# Patient Record
Sex: Female | Born: 1960 | Race: White | Hispanic: No | Marital: Single | Smoking: Former smoker
Health system: Southern US, Community
[De-identification: ages and names within clinical notes are randomized; demographics above are authoritative.]

## PROBLEM LIST (undated history)

## (undated) DIAGNOSIS — G43909 Migraine, unspecified, not intractable, without status migrainosus: Secondary | ICD-10-CM

## (undated) DIAGNOSIS — I639 Cerebral infarction, unspecified: Secondary | ICD-10-CM

## (undated) DIAGNOSIS — M549 Dorsalgia, unspecified: Secondary | ICD-10-CM

## (undated) DIAGNOSIS — I10 Essential (primary) hypertension: Secondary | ICD-10-CM

## (undated) DIAGNOSIS — F419 Anxiety disorder, unspecified: Secondary | ICD-10-CM

## (undated) DIAGNOSIS — S060XAA Concussion with loss of consciousness status unknown, initial encounter: Secondary | ICD-10-CM

## (undated) DIAGNOSIS — S060X9A Concussion with loss of consciousness of unspecified duration, initial encounter: Secondary | ICD-10-CM

## (undated) DIAGNOSIS — S32009A Unspecified fracture of unspecified lumbar vertebra, initial encounter for closed fracture: Secondary | ICD-10-CM

## (undated) HISTORY — PX: BREAST SURGERY: SHX581

## (undated) HISTORY — PX: TONSILECTOMY/ADENOIDECTOMY WITH MYRINGOTOMY: SHX6125

---

## 2006-12-25 ENCOUNTER — Emergency Department (HOSPITAL_COMMUNITY): Admission: EM | Admit: 2006-12-25 | Discharge: 2006-12-25 | Payer: Self-pay | Admitting: Emergency Medicine

## 2007-08-29 ENCOUNTER — Emergency Department (HOSPITAL_COMMUNITY): Admission: EM | Admit: 2007-08-29 | Discharge: 2007-08-30 | Payer: Self-pay | Admitting: Emergency Medicine

## 2008-09-07 ENCOUNTER — Emergency Department (HOSPITAL_COMMUNITY): Admission: EM | Admit: 2008-09-07 | Discharge: 2008-09-07 | Payer: Self-pay | Admitting: Emergency Medicine

## 2009-12-23 ENCOUNTER — Observation Stay (HOSPITAL_COMMUNITY): Admission: EM | Admit: 2009-12-23 | Discharge: 2009-12-25 | Payer: Self-pay | Admitting: Emergency Medicine

## 2009-12-23 ENCOUNTER — Ambulatory Visit: Payer: Self-pay | Admitting: Cardiology

## 2009-12-24 ENCOUNTER — Encounter (INDEPENDENT_AMBULATORY_CARE_PROVIDER_SITE_OTHER): Payer: Self-pay | Admitting: Internal Medicine

## 2010-08-31 LAB — DIFFERENTIAL
Basophils Absolute: 0 10*3/uL (ref 0.0–0.1)
Basophils Relative: 0 % (ref 0–1)
Eosinophils Absolute: 0 10*3/uL (ref 0.0–0.7)
Lymphs Abs: 1 10*3/uL (ref 0.7–4.0)
Monocytes Absolute: 1.4 10*3/uL — ABNORMAL HIGH (ref 0.1–1.0)
Neutro Abs: 9.5 10*3/uL — ABNORMAL HIGH (ref 1.7–7.7)

## 2010-08-31 LAB — COMPREHENSIVE METABOLIC PANEL
ALT: 52 U/L — ABNORMAL HIGH (ref 0–35)
AST: 68 U/L — ABNORMAL HIGH (ref 0–37)
Alkaline Phosphatase: 115 U/L (ref 39–117)
Alkaline Phosphatase: 126 U/L — ABNORMAL HIGH (ref 39–117)
BUN: 3 mg/dL — ABNORMAL LOW (ref 6–23)
CO2: 22 mEq/L (ref 19–32)
CO2: 28 mEq/L (ref 19–32)
Calcium: 8.3 mg/dL — ABNORMAL LOW (ref 8.4–10.5)
Chloride: 103 mEq/L (ref 96–112)
Creatinine, Ser: 0.65 mg/dL (ref 0.4–1.2)
GFR calc Af Amer: >60 mL/min (ref >60)
GFR calc non Af Amer: >60 mL/min (ref >60)
GFR calc non Af Amer: >60 mL/min (ref >60)
Glucose, Bld: 170 mg/dL — ABNORMAL HIGH (ref 70–99)
Potassium: 3.2 mEq/L — ABNORMAL LOW (ref 3.5–5.1)
Sodium: 122 mEq/L — ABNORMAL LOW (ref 135–145)
Sodium: 138 mEq/L (ref 135–145)
Total Bilirubin: 0.5 mg/dL (ref 0.3–1.2)

## 2010-08-31 LAB — CBC
HCT: 40.3 % (ref 36.0–46.0)
Hemoglobin: 12.3 g/dL (ref 12.0–15.0)
MCH: 36 pg — ABNORMAL HIGH (ref 26.0–34.0)
MCHC: 34.2 g/dL (ref 30.0–36.0)
MCV: 102.4 fL — ABNORMAL HIGH (ref 78.0–100.0)
RBC: 3.43 MIL/uL — ABNORMAL LOW (ref 3.87–5.11)
RDW: 13.6 % (ref 11.5–15.5)

## 2010-08-31 LAB — GLUCOSE, CAPILLARY
Glucose-Capillary: 131 mg/dL — ABNORMAL HIGH (ref 70–99)
Glucose-Capillary: 190 mg/dL — ABNORMAL HIGH (ref 70–99)
Glucose-Capillary: 94 mg/dL (ref 70–99)
Glucose-Capillary: 99 mg/dL (ref 70–99)

## 2010-08-31 LAB — CK TOTAL AND CKMB (NOT AT ARMC): Total CK: 76 U/L (ref 7–177)

## 2010-08-31 LAB — VITAMIN B12: Vitamin B-12: 1053 pg/mL — ABNORMAL HIGH (ref 211–911)

## 2010-08-31 LAB — URINALYSIS, ROUTINE W REFLEX MICROSCOPIC
Glucose, UA: NEGATIVE mg/dL
Hgb urine dipstick: NEGATIVE
Specific Gravity, Urine: 1.006 (ref 1.005–1.030)
pH: 6 (ref 5.0–8.0)

## 2010-08-31 LAB — POCT CARDIAC MARKERS
CKMB, poc: <1 ng/mL — ABNORMAL LOW (ref 1.0–8.0)
Troponin i, poc: <0.05 ng/mL (ref 0.00–0.09)

## 2010-08-31 LAB — D-DIMER, QUANTITATIVE: D-Dimer, Quant: 4.17 ug/mL-FEU — ABNORMAL HIGH (ref 0.00–0.48)

## 2010-08-31 LAB — CARDIAC PANEL(CRET KIN+CKTOT+MB+TROPI)
CK, MB: 2 ng/mL (ref 0.3–4.0)
Relative Index: INVALID (ref 0.0–2.5)
Total CK: 85 U/L (ref 7–177)
Troponin I: 0.01 ng/mL (ref 0.00–0.06)

## 2010-08-31 LAB — TSH: TSH: 0.363 u[IU]/mL (ref 0.350–4.500)

## 2010-09-25 LAB — BASIC METABOLIC PANEL
BUN: 1 mg/dL — ABNORMAL LOW (ref 6–23)
Calcium: 9.1 mg/dL (ref 8.4–10.5)
Creatinine, Ser: 0.49 mg/dL (ref 0.4–1.2)
GFR calc Af Amer: >60 mL/min (ref >60)

## 2010-09-25 LAB — D-DIMER, QUANTITATIVE: D-Dimer, Quant: 0.42 ug/mL-FEU (ref 0.00–0.48)

## 2010-09-25 LAB — CBC
MCHC: 34.7 g/dL (ref 30.0–36.0)
Platelets: 204 10*3/uL (ref 150–400)
RBC: 4.23 MIL/uL (ref 3.87–5.11)
WBC: 11.8 10*3/uL — ABNORMAL HIGH (ref 4.0–10.5)

## 2010-09-25 LAB — POCT CARDIAC MARKERS
Myoglobin, poc: 39.4 ng/mL (ref 12–200)
Troponin i, poc: <0.05 ng/mL (ref 0.00–0.09)

## 2014-10-13 ENCOUNTER — Encounter (HOSPITAL_COMMUNITY): Payer: Self-pay | Admitting: Emergency Medicine

## 2014-10-13 ENCOUNTER — Emergency Department (HOSPITAL_COMMUNITY)
Admission: EM | Admit: 2014-10-13 | Discharge: 2014-10-13 | Disposition: A | Discharge status: Home / Self Care | Payer: BLUE CROSS/BLUE SHIELD | Attending: Emergency Medicine | Admitting: Emergency Medicine

## 2014-10-13 ENCOUNTER — Emergency Department (HOSPITAL_COMMUNITY): Payer: BLUE CROSS/BLUE SHIELD

## 2014-10-13 DIAGNOSIS — Y998 Other external cause status: Secondary | ICD-10-CM | POA: Diagnosis not present

## 2014-10-13 DIAGNOSIS — I720 Aneurysm of carotid artery: Secondary | ICD-10-CM | POA: Insufficient documentation

## 2014-10-13 DIAGNOSIS — Z7982 Long term (current) use of aspirin: Secondary | ICD-10-CM | POA: Insufficient documentation

## 2014-10-13 DIAGNOSIS — S0001XA Abrasion of scalp, initial encounter: Secondary | ICD-10-CM | POA: Insufficient documentation

## 2014-10-13 DIAGNOSIS — W01198A Fall on same level from slipping, tripping and stumbling with subsequent striking against other object, initial encounter: Secondary | ICD-10-CM | POA: Diagnosis not present

## 2014-10-13 DIAGNOSIS — Z79899 Other long term (current) drug therapy: Secondary | ICD-10-CM | POA: Insufficient documentation

## 2014-10-13 DIAGNOSIS — S060X1A Concussion with loss of consciousness of 30 minutes or less, initial encounter: Secondary | ICD-10-CM | POA: Diagnosis not present

## 2014-10-13 DIAGNOSIS — Y9289 Other specified places as the place of occurrence of the external cause: Secondary | ICD-10-CM | POA: Insufficient documentation

## 2014-10-13 DIAGNOSIS — Z8781 Personal history of (healed) traumatic fracture: Secondary | ICD-10-CM | POA: Insufficient documentation

## 2014-10-13 DIAGNOSIS — S0990XA Unspecified injury of head, initial encounter: Secondary | ICD-10-CM | POA: Diagnosis present

## 2014-10-13 DIAGNOSIS — Z87891 Personal history of nicotine dependence: Secondary | ICD-10-CM | POA: Insufficient documentation

## 2014-10-13 DIAGNOSIS — R51 Headache: Secondary | ICD-10-CM

## 2014-10-13 DIAGNOSIS — Y9389 Activity, other specified: Secondary | ICD-10-CM | POA: Diagnosis not present

## 2014-10-13 DIAGNOSIS — I671 Cerebral aneurysm, nonruptured: Secondary | ICD-10-CM

## 2014-10-13 DIAGNOSIS — R519 Headache, unspecified: Secondary | ICD-10-CM

## 2014-10-13 HISTORY — DX: Migraine, unspecified, not intractable, without status migrainosus: G43.909

## 2014-10-13 HISTORY — DX: Concussion with loss of consciousness of unspecified duration, initial encounter: S06.0X9A

## 2014-10-13 HISTORY — DX: Unspecified fracture of unspecified lumbar vertebra, initial encounter for closed fracture: S32.009A

## 2014-10-13 HISTORY — DX: Concussion with loss of consciousness status unknown, initial encounter: S06.0XAA

## 2014-10-13 MED ORDER — DIPHENHYDRAMINE HCL 50 MG/ML IJ SOLN
25.0000 mg | Freq: Once | INTRAMUSCULAR | Status: AC
  Administered 2014-10-13: 25 mg via INTRAVENOUS
  Filled 2014-10-13: qty 1

## 2014-10-13 MED ORDER — PROMETHAZINE HCL 25 MG/ML IJ SOLN
25.0000 mg | Freq: Once | INTRAMUSCULAR | Status: AC
  Administered 2014-10-13: 25 mg via INTRAVENOUS
  Filled 2014-10-13: qty 1

## 2014-10-13 MED ORDER — SODIUM CHLORIDE 0.9 % IV BOLUS (SEPSIS)
500.0000 mL | Freq: Once | INTRAVENOUS | Status: AC
  Administered 2014-10-13: 500 mL via INTRAVENOUS

## 2014-10-13 MED ORDER — KETOROLAC TROMETHAMINE 30 MG/ML IJ SOLN
30.0000 mg | Freq: Once | INTRAMUSCULAR | Status: AC
  Administered 2014-10-13: 30 mg via INTRAVENOUS
  Filled 2014-10-13: qty 1

## 2014-10-13 NOTE — Discharge Instructions (Signed)
Concussion A concussion, or closed-head injury, is a brain injury caused by a direct blow to the head or by a quick and sudden movement (jolt) of the head or neck. Concussions are usually not life-threatening. Even so, the effects of a concussion can be serious. If you have had a concussion before, you are more likely to experience concussion-like symptoms after a direct blow to the head.  CAUSES  Direct blow to the head, such as from running into another player during a soccer game, being hit in a fight, or hitting your head on a hard surface.  A jolt of the head or neck that causes the brain to move back and forth inside the skull, such as in a car crash. SIGNS AND SYMPTOMS The signs of a concussion can be hard to notice. Early on, they may be missed by you, family members, and health care providers. You may look fine but act or feel differently. Symptoms are usually temporary, but they may last for days, weeks, or even longer. Some symptoms may appear right away while others may not show up for hours or days. Every head injury is different. Symptoms include:  Mild to moderate headaches that will not go away.  A feeling of pressure inside your head.  Having more trouble than usual:  Learning or remembering things you have heard.  Answering questions.  Paying attention or concentrating.  Organizing daily tasks.  Making decisions and solving problems.  Slowness in thinking, acting or reacting, speaking, or reading.  Getting lost or being easily confused.  Feeling tired all the time or lacking energy (fatigued).  Feeling drowsy.  Sleep disturbances.  Sleeping more than usual.  Sleeping less than usual.  Trouble falling asleep.  Trouble sleeping (insomnia).  Loss of balance or feeling lightheaded or dizzy.  Nausea or vomiting.  Numbness or tingling.  Increased sensitivity to:  Sounds.  Lights.  Distractions.  Vision problems or eyes that tire  easily.  Diminished sense of taste or smell.  Ringing in the ears.  Mood changes such as feeling sad or anxious.  Becoming easily irritated or angry for little or no reason.  Lack of motivation.  Seeing or hearing things other people do not see or hear (hallucinations). DIAGNOSIS Your health care provider can usually diagnose a concussion based on a description of your injury and symptoms. He or she will ask whether you passed out (lost consciousness) and whether you are having trouble remembering events that happened right before and during your injury. Your evaluation might include:  A brain scan to look for signs of injury to the brain. Even if the test shows no injury, you may still have a concussion.  Blood tests to be sure other problems are not present. TREATMENT  Concussions are usually treated in an emergency department, in urgent care, or at a clinic. You may need to stay in the hospital overnight for further treatment.  Tell your health care provider if you are taking any medicines, including prescription medicines, over-the-counter medicines, and natural remedies. Some medicines, such as blood thinners (anticoagulants) and aspirin, may increase the chance of complications. Also tell your health care provider whether you have had alcohol or are taking illegal drugs. This information may affect treatment.  Your health care provider will send you home with important instructions to follow.  How fast you will recover from a concussion depends on many factors. These factors include how severe your concussion is, what part of your brain was injured, your  may affect treatment.  · Your health care provider will send you home with important instructions to follow.  · How fast you will recover from a concussion depends on many factors. These factors include how severe your concussion is, what part of your brain was injured, your age, and how healthy you were before the concussion.  · Most people with mild injuries recover fully. Recovery can take time. In general, recovery is slower in older persons. Also, persons who have had a concussion in the past or have other medical problems may find that it takes longer to recover from their current injury.  HOME  CARE INSTRUCTIONS  General Instructions  · Carefully follow the directions your health care provider gave you.  · Only take over-the-counter or prescription medicines for pain, discomfort, or fever as directed by your health care provider.  · Take only those medicines that your health care provider has approved.  · Do not drink alcohol until your health care provider says you are well enough to do so. Alcohol and certain other drugs may slow your recovery and can put you at risk of further injury.  · If it is harder than usual to remember things, write them down.  · If you are easily distracted, try to do one thing at a time. For example, do not try to watch TV while fixing dinner.  · Talk with family members or close friends when making important decisions.  · Keep all follow-up appointments. Repeated evaluation of your symptoms is recommended for your recovery.  · Watch your symptoms and tell others to do the same. Complications sometimes occur after a concussion. Older adults with a brain injury may have a higher risk of serious complications, such as a blood clot on the brain.  · Tell your teachers, school nurse, school counselor, coach, athletic trainer, or work manager about your injury, symptoms, and restrictions. Tell them about what you can or cannot do. They should watch for:  · Increased problems with attention or concentration.  · Increased difficulty remembering or learning new information.  · Increased time needed to complete tasks or assignments.  · Increased irritability or decreased ability to cope with stress.  · Increased symptoms.  · Rest. Rest helps the brain to heal. Make sure you:  · Get plenty of sleep at night. Avoid staying up late at night.  · Keep the same bedtime hours on weekends and weekdays.  · Rest during the day. Take daytime naps or rest breaks when you feel tired.  · Limit activities that require a lot of thought or concentration. These include:  · Doing homework or job-related  work.  · Watching TV.  · Working on the computer.  · Avoid any situation where there is potential for another head injury (football, hockey, soccer, basketball, martial arts, downhill snow sports and horseback riding). Your condition will get worse every time you experience a concussion. You should avoid these activities until you are evaluated by the appropriate follow-up health care providers.  Returning To Your Regular Activities  You will need to return to your normal activities slowly, not all at once. You must give your body and brain enough time for recovery.  · Do not return to sports or other athletic activities until your health care provider tells you it is safe to do so.  · Ask your health care provider when you can drive, ride a bicycle, or operate heavy machinery. Your ability to react may be slower after a   belt when riding in a car.  Drinking alcohol only in moderation.  Wearing a helmet when biking, skiing, skateboarding, skating, or doing similar activities.  Avoiding activities that could lead to a second concussion, such as contact or recreational sports, until your health care provider says it is okay.  Taking safety measures in your home.  Remove clutter and tripping hazards from floors and stairways.  Use grab bars in bathrooms and handrails by stairs.  Place non-slip mats on floors and in bathtubs.  Improve lighting in dim areas. SEEK MEDICAL CARE IF:  You have increased problems paying attention or  concentrating.  You have increased difficulty remembering or learning new information.  You need more time to complete tasks or assignments than before.  You have increased irritability or decreased ability to cope with stress.  You have more symptoms than before. Seek medical care if you have any of the following symptoms for more than 2 weeks after your injury:  Lasting (chronic) headaches.  Dizziness or balance problems.  Nausea.  Vision problems.  Increased sensitivity to noise or light.  Depression or mood swings.  Anxiety or irritability.  Memory problems.  Difficulty concentrating or paying attention.  Sleep problems.  Feeling tired all the time. SEEK IMMEDIATE MEDICAL CARE IF:  You have severe or worsening headaches. These may be a sign of a blood clot in the brain.  You have weakness (even if only in one hand, leg, or part of the face).  You have numbness.  You have decreased coordination.  You vomit repeatedly.  You have increased sleepiness.  One pupil is larger than the other.  You have convulsions.  You have slurred speech.  You have increased confusion. This may be a sign of a blood clot in the brain.  You have increased restlessness, agitation, or irritability.  You are unable to recognize people or places.  You have neck pain.  It is difficult to wake you up.  You have unusual behavior changes.  You lose consciousness. MAKE SURE YOU:  Understand these instructions.  Will watch your condition.  Will get help right away if you are not doing well or get worse. Document Released: 08/22/2003 Document Revised: 06/06/2013 Document Reviewed: 12/22/2012 [facility] Patient Information 2015 [place], [place]. This information is not intended to replace advice given to you by your health care provider. Make sure you discuss any questions you have with your health care provider.  Cerebral Aneurysm An aneurysm is the bulging or ballooning out  of part of the weakened wall of a vein or artery. An aneurysm in the vein or artery of the brain is called a brain aneurysm, or cerebral aneurysm.  Aneurysms are a risk to your health because they may leak or rupture. Once the aneurysm leaks or ruptures, bleeding occurs. If the bleeding occurs within the brain tissue, the condition is called an intracerebral hemorrhage. An intracerebral hemorrhage can result in a hemorrhagic stroke. If the bleeding occurs in the area between the brain and the thin tissues that cover the brain, the condition is called a subarachnoid hemorrhage. This increases the pressure on the brain and causes some areas of the brain to not get the necessary blood flow. The blood from the ruptured aneurysm collects and presses on the surrounding brain tissue. A subarachnoid hemorrhage can cause a stroke. A ruptured cerebral aneurysm is a medical emergency. This can cause permanent damage and loss of brain function. CAUSES A cerebral aneurysm is caused when a weakened part of  the blood vessel expands. The blood vessel expands due to the constant pressure from the flow of blood through the weakened blood vessel. Usually the aneurysm expands slowly. As the weakened aneurysm expands, the walls of the aneurysm become weaker. Aneurysms may be associated with diseases that weaken and damage the walls of your blood vessels or blood vessels that develop abnormally. Some known causes for cerebral aneurysms are:  Head trauma.  Infection.  Use of "recreational drugs" such as cocaine or amphetamines. RISK FACTORS People at risk for a cerebral aneurysm or hemorrhagic stroke usually have one or more risk factors, which include:  Having high blood pressure (hypertension).  Abusing alcohol.  Having abnormal blood vessels present since birth.  Having certain bleeding disorders, such as hemophilia, sickle cell disease, or liver disease.  Taking blood thinners (anticoagulants).  Smoking. SIGNS  AND SYMPTOMS  The signs and symptoms of an unruptured cerebral aneurysm will partly depend on its size and rate of growth. A small, unchanging aneurysm generally does not produce symptoms. A larger aneurysm that is steadily growing can increase pressure on the brain or nerves. That increased pressure from the unruptured cerebral aneurysm can cause:  A headache.  Problems with your vision.  Numbness or weakness in an arm or leg.  Problems with memory.  Problems speaking.  Seizures. If an aneurysm leaks or bursts, it can cause a stroke and be life-threatening. Symptoms may include:  A sudden, severe headache with no known cause. The headache is often described as the worst headache ever experienced.  Nausea or vomiting, especially when combined with other symptoms such as a headache.  Sudden weakness or numbness of the face, arm, or leg, especially on one side of the body.  Sudden trouble walking or difficulty moving arms or legs.  Sudden confusion.  Sudden personality changes.  Trouble speaking (aphasia) or understanding.  Difficulty swallowing.  Sudden trouble seeing in one or both eyes.  Double vision.  Dizziness.  Loss of balance or coordination.  Intolerance to light.  Stiff neck. DIAGNOSIS  A CTA (computed tomographic angiography) may be performed to diagnose an aneurysm. A CTA uses dye and a CT scanner to take images of your blood vessels. An MRA (magnetic resonance angiography) may be used to diagnose an aneurysm. An MRA is performed in an MRI machine. While in the MRI machine, images of your blood vessels are taken. A cerebral aneurysm may also be diagnosed with a cerebral angiogram. A cerebral angiogram requires a tube called a catheter to be inserted into a blood vessel and advanced to the blood vessels in your neck. Dye is then injected while X-ray images are taken to show the blood vessels in your brain. TREATMENT  Unruptured Aneurysms Treatment is complex  when an aneurysm is found and it is not causing problems. Treatment is very individualized, as each case is different. Many things must be considered, such as the size and exact location of your aneurysm, your age, your overall health, and your feelings and preferences. Small aneurysms in certain locations of the brain have a very low chance of bleeding or rupturing. These small aneurysms may not be treated. However, depending on the size and location of the aneurysm, treatments may be recommended and include:  Coiling. During this procedure, a catheter is inserted and advanced through a blood vessel. Once the catheter reaches the aneurysm, tiny coils are used to block blood flow into the aneurysm.  Surgical clipping. During surgery, a clip is placed at the base  of the aneurysm. The clip prevents blood from continuing to enter the aneurysm. Ruptured Aneurysms Immediate emergency surgery may be needed to help prevent damage to the brain and to reduce the risk of rebleeding. Timing of treatment is an important factor in the prevention of complications. Successful early treatment of a ruptured aneurysm (within the first 3 days of a bleed) helps to prevent rebleeding and blood vessel spasm. In some cases, there may be a reason to treat later (10-14 days after a rupture). Many things are considered when making this decision, and each case is handled individually. HOME CARE INSTRUCTIONS  Take medicines only as instructed by your health care provider.  Eat healthy foods. It is recommended that you eat 5 or more servings of fruits and vegetables each day. Foods may need to be a special consistency (soft or pureed), or small bites may need to be taken if you have had a ruptured aneurysm or stroke. Certain dietary changes may be advised to address high blood pressure, high cholesterol, diabetes, or obesity.  Food choices that are low in salt (sodium), saturated fat, trans fat, and cholesterol are recommended to  manage high blood pressure.  Food choices that are high in fiber and low in saturated fat, trans fat, and cholesterol are recommended to control cholesterol levels.  Controlling carbohydrate and sugar intake is recommended to manage diabetes.  Reducing calorie intake and making food choices that are low in sodium, saturated fat, trans fat, and cholesterol are recommended to manage obesity.  Maintain a healthy weight.  Stay physically active. It is recommended that you get at least 30 minutes of activity on most or all days.  Do not smoke.  Limit alcohol use. Moderate alcohol use is considered to be:  No more than 2 drinks each day for men.  No more than 1 drink each day for nonpregnant women.  Stop drug abuse.  A safe home environment is important to reduce the risk of falls. Your health care provider may arrange for specialists to evaluate your home. Having grab bars in the bedroom and bathroom is often important. Your health care provider may arrange for special equipment to be used at home, such as raised toilets and a seat for the shower.  Physical, occupational, and speech therapy. Ongoing therapy may be needed to maximize your recovery after a ruptured aneurysm or stroke. If you have been advised to use a walker or a cane, use it at all times. Be sure to keep your therapy appointments.  Follow all instructions for follow-up with your health care provider. This is very important. This includes any referrals, physical therapy, rehabilitation, and laboratory tests. Proper follow-up may prevent an aneurysm rupture or a stroke. SEEK IMMEDIATE MEDICAL CARE IF:  You have a sudden, severe headache with no known cause.  You have sudden nausea or vomiting with a severe headache.  You have sudden weakness or numbness of the face, arm, or leg, especially on one side of the body.  You have sudden trouble walking or difficulty moving arms or legs.  You have sudden confusion.  You have  trouble speaking or understanding.  You have sudden trouble seeing in one or both eyes.  You have a sudden loss of balance or coordination.  You have a stiff neck.  You have difficulty breathing.  You have a partial or total loss of consciousness. Any of these symptoms may represent a serious problem that is an emergency. Do not wait to see if the symptoms  will go away. Get medical help at once. Call your local emergency services (911 in [place] Do not drive yourself to the hospital. Document Released: 02/21/2002 Document Revised: 10/16/2013 Document Reviewed: 11/17/2012 [facility] Patient Information 2015 [employer] [place]. This information is not intended to replace advice given to you by your health care provider. Make sure you discuss any questions you have with your health care provider.

## 2014-10-13 NOTE — ED Provider Notes (Signed)
CSN: [id number]     Arrival date & time 10/13/14  [id number] History   First MD Initiated Contact with Patient 10/13/14 1001     Chief Complaint  Patient presents with  . Fall  . Headache     (Consider location/radiation/quality/duration/timing/severity/associated sxs/prior Treatment) Patient is a 54 y.o. female presenting with fall and headaches. The history is provided by the patient.  Fall Associated symptoms include headaches. Pertinent negatives include no chest pain, no abdominal pain and no shortness of breath.  Headache Associated symptoms: nausea, photophobia and vomiting   Associated symptoms: no abdominal pain, no back pain, no diarrhea, no eye pain, no neck stiffness, no numbness and no weakness    patient 2 days ago hit her head on a wall and then had a syncopal episode. Was not seen in the hospital at that time. Since then she has had headaches. It is somewhat similar to her previous migraines as throbbing the back of her head. She's had nausea. She's had some photophobia. No fevers. States the headache is continued to get worse. No visual changes. She has has some tingling in her hands and feet, which proceeded the injury and has been worked up in the past. States she had a total of 2 beers that day in the last one was an hour before she hit her head.  Past Medical History  Diagnosis Date  . Migraine   . Concussion   . Fracture of lumbar vertebra L 5 fracture   History reviewed. No pertinent past surgical history. No family history on file. History  Substance Use Topics  . Smoking status: Former Games developermoker  . Smokeless tobacco: Not on file  . Alcohol Use: Yes   OB History    No data available     Review of Systems  Constitutional: Negative for activity change and appetite change.  Eyes: Positive for photophobia. Negative for pain.  Respiratory: Negative for chest tightness and shortness of breath.   Cardiovascular: Negative for chest pain and leg swelling.   Gastrointestinal: Positive for nausea and vomiting. Negative for abdominal pain and diarrhea.  Genitourinary: Negative for flank pain.  Musculoskeletal: Negative for back pain and neck stiffness.  Skin: Negative for rash.  Neurological: Positive for headaches. Negative for weakness and numbness.  Psychiatric/Behavioral: Negative for behavioral problems.      Allergies  Review of patient's allergies indicates no known allergies.  Home Medications   Prior to Admission medications   Medication Sig Start Date End Date Taking? Authorizing Provider  acetaminophen (TYLENOL) 500 MG tablet Take 500 mg by mouth every 6 (six) hours as needed for headache.   Yes Historical Provider, MD  aspirin 325 MG EC tablet Take 325 mg by mouth daily as needed for pain (Back pain).   Yes Historical Provider, MD  Aspirin-Salicylamide-Caffeine (BC HEADACHE POWDER PO) Take 1 packet by mouth daily as needed (Headache).   Yes Historical Provider, MD  Hypromellose (ARTIFICIAL TEARS OP) Apply 1 drop to eye daily as needed (Dry eyes).   Yes Historical Provider, MD  Multiple Vitamin (MULTIVITAMIN WITH MINERALS) TABS tablet Take 1 tablet by mouth daily.   Yes Historical Provider, MD  NASAL SALINE NA Place 1 spray into the nose daily as needed (Nasal congestion, allergies).   Yes Historical Provider, MD  oxymetazoline (AFRIN) 0.05 % nasal spray Place 1 spray into both nostrils daily as needed for congestion.   Yes Historical Provider, MD   BP 157/95 mmHg  Pulse 101  Temp(Src) 98.3 F (36.8  C) (Oral)  Resp 20  Ht  (1.575 m)  Wt 127 lb (57.607 kg)  BMI 23.22 kg/m2  SpO2 93% Physical Exam  Constitutional: She is oriented to person, place, and time. She appears well-developed and well-nourished.  HENT:  Head: Normocephalic.  Tenderness to occipital area scalp. Small area abrasion without active bleeding.  Eyes: EOM are normal. Pupils are equal, round, and reactive to light.  Neck: Normal range of motion. Neck  supple.  Cardiovascular: Normal rate, regular rhythm and normal heart sounds.   No murmur heard. Pulmonary/Chest: Effort normal and breath sounds normal. No respiratory distress. She has no wheezes. She has no rales.  Abdominal: Soft. Bowel sounds are normal. She exhibits no distension. There is no tenderness. There is no rebound and no guarding.  Musculoskeletal: Normal range of motion.  Neurological: She is alert and oriented to person, place, and time. No cranial nerve deficit.  Skin: Skin is warm.  Psychiatric: She has a normal mood and affect. Her speech is normal.  Nursing note and vitals reviewed.   ED Course  Procedures (including critical care time) Labs Review Labs Reviewed - No data to display  Imaging Review Ct Head Wo Contrast  10/13/2014   CLINICAL DATA:  Two day history of headache with dizziness and gait disturbance. Fall 2 days prior  EXAM: CT HEAD WITHOUT CONTRAST  TECHNIQUE: Contiguous axial images were obtained from the base of the skull through the vertex without intravenous contrast.  COMPARISON:  August 29, 2007  FINDINGS: The ventricles are normal in size and configuration. There is no intracranial mass, hemorrhage, extra-axial fluid collection, or midline shift.  There is an apparent 1 x 1 cm aneurysm arising at the junction of the distal left internal carotid artery and origin left middle cerebral artery, best seen on axial slice 8 series 2.  No gray-white compartment lesions are identified. No acute infarct apparent. Bony calvarium appears intact. The mastoid air cells are clear.  IMPRESSION: Apparent 1 x 1 cm aneurysm arising at the junction of the distal left internal carotid artery in origin of left middle cerebral artery. Correlation with CT or MR angiography in this regard may be helpful to further evaluate this area. There is no surrounding edema. There is no hemorrhage or mass effect. No extra-axial fluid collection. Gray-white compartments appear normal. No acute  infarct apparent.   Electronically Signed   By: Bretta Bang III M.D.   On: 10/13/2014 11:50     EKG Interpretation None      MDM   Final diagnoses:  Nonintractable headache, unspecified chronicity pattern, unspecified headache type  Concussion, with loss of consciousness of 30 minutes or less, initial encounter  Aneurysm of left internal carotid artery    Patient with fall and headache. History of migraines. Head CT reassuring for bleed but does show an aneurysm. Discussed with Dr. Franky Macho who will see the patient in follow-up. The aneurysm was present on a prior CT also.    Benjiman Core, MD 10/13/14 [phone]

## 2014-10-13 NOTE — ED Notes (Signed)
Received pt from home with c/o fall and headache since Thursday.  EMS came out Thursday and checked pt out, pt did not want to be transported to hospital at that time. Since Thursday pt has continued to have difficulty with balance. Pt uses a cane when she feels necessary. Pt given 4mg  of Zofran for nausea by EMS with relief.

## 2014-10-13 NOTE — ED Notes (Signed)
Patient transported to CT 

## 2014-12-06 ENCOUNTER — Ambulatory Visit: Payer: Self-pay | Admitting: Family

## 2019-09-21 ENCOUNTER — Ambulatory Visit: Payer: BC Managed Care – PPO | Attending: Family

## 2019-09-21 DIAGNOSIS — Z23 Encounter for immunization: Secondary | ICD-10-CM

## 2019-09-21 NOTE — Progress Notes (Signed)
   Covid-19 Vaccination Clinic  Name:  Kamilah Correia    MRN: [id number] DOB: Nov 11, 1960  09/21/2019  Ms. Zavaleta was observed post Covid-19 immunization for 15 minutes without incident. She was provided with Vaccine Information Sheet and instruction to access the V-Safe system.   Ms. Nesby was instructed to call 911 with any severe reactions post vaccine: Marland Kitchen Difficulty breathing  . Swelling of face and throat  . A fast heartbeat  . A bad rash all over body  . Dizziness and weakness   Immunizations Administered    Name Date Dose VIS Date Route   Moderna COVID-19 Vaccine 09/21/2019 12:08 PM 0.5 mL 05/16/2019 Intramuscular   Manufacturer: Moderna   Lot: [id number]   NDC: 58527-782-42

## 2019-10-24 ENCOUNTER — Ambulatory Visit: Payer: BC Managed Care – PPO | Attending: Family

## 2019-10-24 DIAGNOSIS — Z23 Encounter for immunization: Secondary | ICD-10-CM

## 2019-10-24 NOTE — Progress Notes (Signed)
   Covid-19 Vaccination Clinic  Name:  Yuleimy Kretz    MRN: [id number] DOB: 09/18/1960  10/24/2019  Ms. Rengel was observed post Covid-19 immunization for 15 minutes without incident. She was provided with Vaccine Information Sheet and instruction to access the V-Safe system.   Ms. Costabile was instructed to call 911 with any severe reactions post vaccine: Marland Kitchen Difficulty breathing  . Swelling of face and throat  . A fast heartbeat  . A bad rash all over body  . Dizziness and weakness   Immunizations Administered    Name Date Dose VIS Date Route   Moderna COVID-19 Vaccine 10/24/2019 10:58 AM 0.5 mL 05/2019 Intramuscular   Manufacturer: Moderna   Lot: [id number]   NDC: 41740-814-48

## 2020-12-07 ENCOUNTER — Ambulatory Visit
Admission: EM | Admit: 2020-12-07 | Discharge: 2020-12-07 | Disposition: A | Discharge status: Home / Self Care | Payer: BC Managed Care – PPO | Attending: Emergency Medicine | Admitting: Emergency Medicine

## 2020-12-07 ENCOUNTER — Other Ambulatory Visit: Payer: Self-pay

## 2020-12-07 ENCOUNTER — Ambulatory Visit (HOSPITAL_COMMUNITY)
Admission: EM | Admit: 2020-12-07 | Discharge: 2020-12-07 | Disposition: A | Discharge status: Home / Self Care | Payer: BC Managed Care – PPO

## 2020-12-07 ENCOUNTER — Encounter: Payer: Self-pay | Admitting: Emergency Medicine

## 2020-12-07 ENCOUNTER — Ambulatory Visit (INDEPENDENT_AMBULATORY_CARE_PROVIDER_SITE_OTHER): Payer: BC Managed Care – PPO

## 2020-12-07 DIAGNOSIS — W19XXXA Unspecified fall, initial encounter: Secondary | ICD-10-CM

## 2020-12-07 DIAGNOSIS — S52531A Colles' fracture of right radius, initial encounter for closed fracture: Secondary | ICD-10-CM | POA: Diagnosis not present

## 2020-12-07 DIAGNOSIS — M25531 Pain in right wrist: Secondary | ICD-10-CM | POA: Diagnosis not present

## 2020-12-07 MED ORDER — IBUPROFEN 800 MG PO TABS: 800.0000 mg | ORAL_TABLET | Freq: Three times a day (TID) | ORAL | 0 refills | Status: DC

## 2020-12-07 NOTE — Progress Notes (Signed)
Orthopedic Tech Progress Note Patient Details:  Sanae Willetts 1961-05-20 [id number]  Ortho Devices Type of Ortho Device: Sugartong splint Ortho Device/Splint Location: RUE Ortho Device/Splint Interventions: Application, Ordered   Post Interventions Patient Tolerated: Well Instructions Provided: Care of device, Poper ambulation with device  Sonja Manseau A Janiece Scovill 12/07/2020, 3:30 PM

## 2020-12-07 NOTE — ED Triage Notes (Signed)
Pt here for fall 1 week ago with right wrist pain

## 2020-12-07 NOTE — ED Provider Notes (Signed)
[name]    CSN: [id number] Arrival date & time: 12/07/20  1305      History   Chief Complaint Chief Complaint  Patient presents with   Fall     HPI Atiyah Bauer is a 60 y.o. female presenting today for evaluation of arm injury.  Reports falling 1 week ago and since has had right wrist pain.  Reports FOOSH injury.  Since has continued to have pain and swelling to her wrist.  HPI  Past Medical History:  Diagnosis Date   Concussion    Fracture of lumbar vertebra (HCC) L 5 fracture   Migraine     There are no problems to display for this patient.   History reviewed. No pertinent surgical history.  OB History   No obstetric history on file.      Home Medications    Prior to Admission medications   Medication Sig Start Date End Date Taking? Authorizing Provider  ibuprofen (ADVIL) 800 MG tablet Take 1 tablet (800 mg total) by mouth 3 (three) times daily. 12/07/20  Yes Griffin Gerrard C, PA-C  acetaminophen (TYLENOL) 500 MG tablet Take 500 mg by mouth every 6 (six) hours as needed for headache.    [provider]  aspirin 325 MG EC tablet Take 325 mg by mouth daily as needed for pain (Back pain).    [provider]  Aspirin-Salicylamide-Caffeine (BC HEADACHE POWDER PO) Take 1 packet by mouth daily as needed (Headache).    [provider]  Hypromellose (ARTIFICIAL TEARS OP) Apply 1 drop to eye daily as needed (Dry eyes).    [provider]  Multiple Vitamin (MULTIVITAMIN WITH MINERALS) TABS tablet Take 1 tablet by mouth daily.    [provider]  NASAL SALINE NA Place 1 spray into the nose daily as needed (Nasal congestion, allergies).    [provider]  oxymetazoline (AFRIN) 0.05 % nasal spray Place 1 spray into both nostrils daily as needed for congestion.    [provider]    Family History History reviewed. No pertinent family history.  Social History Social History   Tobacco Use    Smoking status: Former    Pack years: 0.00  Substance Use Topics   Alcohol use: Yes   Drug use: No     Allergies   Patient has no known allergies.   Review of Systems Review of Systems  Constitutional:  Negative for fatigue and fever.  Eyes:  Negative for visual disturbance.  Respiratory:  Negative for shortness of breath.   Cardiovascular:  Negative for chest pain.  Gastrointestinal:  Negative for abdominal pain, nausea and vomiting.  Musculoskeletal:  Positive for arthralgias and joint swelling.  Skin:  Negative for color change, rash and wound.  Neurological:  Negative for dizziness, weakness, light-headedness and headaches.    Physical Exam Triage Vital Signs ED Triage Vitals  Enc Vitals Group     BP      Pulse      Resp      Temp      Temp src      SpO2      Weight      Height      Head Circumference      Peak Flow      Pain Score      Pain Loc      Pain Edu?      Excl. in GC?    No data found.  Updated Vital Signs BP Marland Kitchen)  148/81 (BP Location: Right Arm)   Pulse 75   Temp 97.8 F (36.6 C) (Oral)   Resp 18   SpO2 94%   Visual Acuity Right Eye Distance:   Left Eye Distance:   Bilateral Distance:    Right Eye Near:   Left Eye Near:    Bilateral Near:     Physical Exam Vitals and nursing note reviewed.  Constitutional:      Appearance: She is well-developed.     Comments: No acute distress  HENT:     Head: Normocephalic and atraumatic.     Nose: Nose normal.  Eyes:     Conjunctiva/sclera: Conjunctivae normal.  Cardiovascular:     Rate and Rhythm: Normal rate.  Pulmonary:     Effort: Pulmonary effort is normal. No respiratory distress.  Abdominal:     General: There is no distension.  Musculoskeletal:        General: Normal range of motion.     Cervical back: Neck supple.     Comments: Right wrist: Swelling and deformity noted to radial aspect, tenderness to palpation over this area, radial pulse 2+, sensation intact distally, cap  refill brisk  Skin:    General: Skin is warm and dry.  Neurological:     Mental Status: She is alert and oriented to person, place, and time.     UC Treatments / Results  Labs (all labs ordered are listed, but only abnormal results are displayed) Labs Reviewed - No data to display  EKG   Radiology DG Wrist Complete Right  Result Date: 12/07/2020 CLINICAL DATA:  Fall, RIGHT wrist pain. EXAM: RIGHT WRIST - COMPLETE 3+ VIEW COMPARISON:  None FINDINGS: Comminuted impacted distal radius fracture with intra-articular extension into the radiocarpal joint and distal radioulnar joint. Degenerative changes about the wrist with first [facility] degenerative changes and degenerative changes about the scaphoid. Foreshortening of the distal radius is present along with a mildly displaced ulnar styloid fracture. No additional signs of fracture. Osteopenia. Soft tissue swelling about the wrist. Lateral view displays dorsal tilt of the radial articular surface and dorsal displacement of the distal radius approximately 5 mm. IMPRESSION: 1. Angulated, impacted and mildly comminuted fracture of the distal radius with potential intra-articular extension and dorsal displacement. 2. Mildly displaced ulnar styloid fracture. Electronically Signed   By: Donzetta Kohut M.D.   On: 12/07/2020 13:55    Procedures Procedures (including critical care time)  Medications Ordered in UC Medications - No data to display  Initial Impression / Assessment and Plan / UC Course  I have reviewed the triage vital signs and the nursing notes.  Pertinent labs & imaging results that were available during my care of the patient were reviewed by me and considered in my medical decision making (see chart for details).     Angulated, impacted and mildly comminuted fracture of distal radius with dorsal displacement, mildly displaced ulnar styloid fracture, injury occurred 1 week ago, neurovascularly intact.  Will place with sugar-tong and  have follow-up with orthopedics this week.  Sending to [facility] urgent care as we do not have appropriate splinting supplies here, will have [facility] place at [place].  Ibuprofen refilled.  Discussed strict return precautions. Patient verbalized understanding and is agreeable with plan.  Final Clinical Impressions(s) / UC Diagnoses   Final diagnoses:  Closed Colles' fracture of right radius, initial encounter     Discharge Instructions      Please go to the main [facility] urgent care to have splint placed  Follow-up with orthopedics-contact first thing Monday morning      ED Prescriptions     Medication Sig Dispense Auth. Provider   ibuprofen (ADVIL) 800 MG tablet Take 1 tablet (800 mg total) by mouth 3 (three) times daily. 21 tablet Berkleigh Beckles, [place] C, PA-C      PDMP not reviewed this encounter.   Lew Dawes, [place] 12/07/20 1419

## 2020-12-07 NOTE — Discharge Instructions (Addendum)
Please go to the main [facility] urgent care to have splint placed Follow-up with orthopedics-contact first thing Monday morning

## 2021-01-19 ENCOUNTER — Other Ambulatory Visit: Payer: Self-pay

## 2021-01-19 ENCOUNTER — Emergency Department (HOSPITAL_BASED_OUTPATIENT_CLINIC_OR_DEPARTMENT_OTHER): Payer: BC Managed Care – PPO

## 2021-01-19 ENCOUNTER — Observation Stay (HOSPITAL_BASED_OUTPATIENT_CLINIC_OR_DEPARTMENT_OTHER)
Admission: EM | Admit: 2021-01-19 | Discharge: 2021-01-21 | Disposition: A | Discharge status: Home / Self Care | Payer: BC Managed Care – PPO | Attending: Internal Medicine | Admitting: Internal Medicine

## 2021-01-19 ENCOUNTER — Encounter (HOSPITAL_BASED_OUTPATIENT_CLINIC_OR_DEPARTMENT_OTHER): Payer: Self-pay | Admitting: Emergency Medicine

## 2021-01-19 DIAGNOSIS — Z8673 Personal history of transient ischemic attack (TIA), and cerebral infarction without residual deficits: Secondary | ICD-10-CM | POA: Diagnosis not present

## 2021-01-19 DIAGNOSIS — R0602 Shortness of breath: Secondary | ICD-10-CM | POA: Diagnosis not present

## 2021-01-19 DIAGNOSIS — E876 Hypokalemia: Secondary | ICD-10-CM | POA: Insufficient documentation

## 2021-01-19 DIAGNOSIS — R202 Paresthesia of skin: Secondary | ICD-10-CM | POA: Insufficient documentation

## 2021-01-19 DIAGNOSIS — Z79899 Other long term (current) drug therapy: Secondary | ICD-10-CM | POA: Diagnosis not present

## 2021-01-19 DIAGNOSIS — Z20822 Contact with and (suspected) exposure to covid-19: Secondary | ICD-10-CM | POA: Diagnosis not present

## 2021-01-19 DIAGNOSIS — R0789 Other chest pain: Secondary | ICD-10-CM | POA: Diagnosis not present

## 2021-01-19 DIAGNOSIS — M79602 Pain in left arm: Secondary | ICD-10-CM | POA: Diagnosis present

## 2021-01-19 DIAGNOSIS — E871 Hypo-osmolality and hyponatremia: Secondary | ICD-10-CM | POA: Diagnosis present

## 2021-01-19 DIAGNOSIS — Z87891 Personal history of nicotine dependence: Secondary | ICD-10-CM | POA: Diagnosis not present

## 2021-01-19 DIAGNOSIS — Z7982 Long term (current) use of aspirin: Secondary | ICD-10-CM | POA: Diagnosis not present

## 2021-01-19 DIAGNOSIS — I10 Essential (primary) hypertension: Secondary | ICD-10-CM | POA: Diagnosis present

## 2021-01-19 DIAGNOSIS — R531 Weakness: Secondary | ICD-10-CM

## 2021-01-19 DIAGNOSIS — R079 Chest pain, unspecified: Secondary | ICD-10-CM | POA: Diagnosis present

## 2021-01-19 HISTORY — DX: Dorsalgia, unspecified: M54.9

## 2021-01-19 HISTORY — DX: Essential (primary) hypertension: I10

## 2021-01-19 HISTORY — DX: Cerebral infarction, unspecified: I63.9

## 2021-01-19 HISTORY — DX: Anxiety disorder, unspecified: F41.9

## 2021-01-19 LAB — COMPREHENSIVE METABOLIC PANEL
ALT: 28 U/L (ref 0–44)
AST: 28 U/L (ref 15–41)
Albumin: 3.9 g/dL (ref 3.5–5.0)
Alkaline Phosphatase: 104 U/L (ref 38–126)
Anion gap: 8 (ref 5–15)
BUN: 12 mg/dL (ref 8–23)
CO2: 27 mmol/L (ref 22–32)
Calcium: 8.9 mg/dL (ref 8.9–10.3)
Chloride: 91 mmol/L — ABNORMAL LOW (ref 98–111)
Creatinine, Ser: 0.43 mg/dL — ABNORMAL LOW (ref 0.44–1.00)
GFR, Estimated: >60 mL/min (ref >60)
Glucose, Bld: 106 mg/dL — ABNORMAL HIGH (ref 70–99)
Potassium: 4.2 mmol/L (ref 3.5–5.1)
Sodium: 126 mmol/L — ABNORMAL LOW (ref 135–145)
Total Bilirubin: 0.1 mg/dL — ABNORMAL LOW (ref 0.3–1.2)
Total Protein: 7 g/dL (ref 6.5–8.1)

## 2021-01-19 LAB — CBC WITH DIFFERENTIAL/PLATELET
Abs Immature Granulocytes: 0.02 10*3/uL (ref 0.00–0.07)
Basophils Absolute: 0 10*3/uL (ref 0.0–0.1)
Basophils Relative: 1 %
Eosinophils Absolute: 0.1 10*3/uL (ref 0.0–0.5)
Eosinophils Relative: 2 %
HCT: 36.8 % (ref 36.0–46.0)
Hemoglobin: 13.4 g/dL (ref 12.0–15.0)
Immature Granulocytes: 0 %
Lymphocytes Relative: 23 %
Lymphs Abs: 1.5 10*3/uL (ref 0.7–4.0)
MCH: 35.7 pg — ABNORMAL HIGH (ref 26.0–34.0)
MCHC: 36.4 g/dL — ABNORMAL HIGH (ref 30.0–36.0)
MCV: 98.1 fL (ref 80.0–100.0)
Monocytes Absolute: 0.7 10*3/uL (ref 0.1–1.0)
Monocytes Relative: 11 %
Neutro Abs: 4.2 10*3/uL (ref 1.7–7.7)
Neutrophils Relative %: 63 %
Platelets: 357 10*3/uL (ref 150–400)
RBC: 3.75 MIL/uL — ABNORMAL LOW (ref 3.87–5.11)
RDW: 11.7 % (ref 11.5–15.5)
WBC: 6.6 10*3/uL (ref 4.0–10.5)
nRBC: 0 % (ref 0.0–0.2)

## 2021-01-19 LAB — RESP PANEL BY RT-PCR (FLU A&B, COVID) ARPGX2
Influenza A by PCR: NEGATIVE
Influenza B by PCR: NEGATIVE
SARS Coronavirus 2 by RT PCR: NEGATIVE

## 2021-01-19 LAB — TROPONIN I (HIGH SENSITIVITY): Troponin I (High Sensitivity): 4 ng/L (ref <18)

## 2021-01-19 MED ORDER — SODIUM CHLORIDE 0.9 % IV BOLUS
1000.0000 mL | Freq: Once | INTRAVENOUS | Status: AC
  Administered 2021-01-19: 1000 mL via INTRAVENOUS

## 2021-01-19 MED ORDER — KETOROLAC TROMETHAMINE 30 MG/ML IJ SOLN
15.0000 mg | Freq: Once | INTRAMUSCULAR | Status: AC
  Administered 2021-01-19: 15 mg via INTRAVENOUS
  Filled 2021-01-19: qty 1

## 2021-01-19 MED ORDER — DIPHENHYDRAMINE HCL 50 MG/ML IJ SOLN
50.0000 mg | Freq: Once | INTRAMUSCULAR | Status: AC
  Administered 2021-01-19: 50 mg via INTRAVENOUS
  Filled 2021-01-19: qty 1

## 2021-01-19 MED ORDER — METOCLOPRAMIDE HCL 5 MG/ML IJ SOLN
10.0000 mg | Freq: Once | INTRAMUSCULAR | Status: AC
  Administered 2021-01-19: 10 mg via INTRAVENOUS
  Filled 2021-01-19: qty 2

## 2021-01-19 NOTE — Progress Notes (Signed)
Patient accepted to tele bed for chest pain obs and hyponatremia. [facility] to assume care upon arrival to accepting facility. Recommend following another troponin.

## 2021-01-19 NOTE — ED Notes (Addendum)
Pt oxygen dropped to 80s, this RN went to re-assess, pt snoring loudly; placed on 2LNC, oxygen returned to above 94%; EDP and RT aware

## 2021-01-19 NOTE — ED Triage Notes (Signed)
Pt from home via [facility]. Pt reports left arm pain. Denies injury. Pt stating "it feels like I was punched in my bicep."

## 2021-01-19 NOTE — ED Notes (Signed)
Pt ambulated to bathroom with standby, gait steady did not need assistance

## 2021-01-19 NOTE — ED Provider Notes (Signed)
[facility] EMERGENCY DEPARTMENT Provider Note   CSN: [id number] Arrival date & time: 01/19/21  1646     History Chief Complaint  Patient presents with   Arm Pain    Sarah Kelly is a 60 y.o. female.  HPI  Patient presents with left arm pain.  She had tingling and numbness to the pinky on the left arm starting 4 days ago, has been constant and does not extend past the PIP joint.  It is not tender to touch, is not cold.  Has not tried any alleviating factors, denies any aggravating factors.  States that she also started feeling as if she was punched in the upper left arm, denies any trauma to the area but states the pain feels like a throbbing pain.  She is not on any blood thinners, states she has a history of stroke in 2017.  States the symptoms feel somewhat similar.  She also endorses this morning she woke up from a severe sudden onset of a headache.  She checked her blood pressure and it was 190/98.  She took her blood pressure medicine. There is associated nausea and one episode of emesis. She had about 20 minutes of chest pain that resolved on its own and she returned back to sleep.  No history of MI, no history of PE.  Patient had a closed Coley fracture repair on 6/27 to the right arm.  She has been having intermittent episodes of emesis, weakness, dizziness since then.    Patient has a history of migraines, she is having unilateral headache, photophobia that has been ongoing since this morning.  Has not tried any alleviating factors, states it feels different than the initial headache she had when she first woke up in the morning.  Past Medical History:  Diagnosis Date   Concussion    Fracture of lumbar vertebra (HCC) L 5 fracture   Migraine     There are no problems to display for this patient.   History reviewed. No pertinent surgical history.   OB History   No obstetric history on file.     No family history on file.  Social History   Tobacco Use    Smoking status: Former  Substance Use Topics   Alcohol use: Yes   Drug use: No    Home Medications Prior to Admission medications   Medication Sig Start Date End Date Taking? Authorizing Provider  acetaminophen (TYLENOL) 500 MG tablet Take 500 mg by mouth every 6 (six) hours as needed for headache.    [provider]  aspirin 325 MG EC tablet Take 325 mg by mouth daily as needed for pain (Back pain).    [provider]  Aspirin-Salicylamide-Caffeine (BC HEADACHE POWDER PO) Take 1 packet by mouth daily as needed (Headache).    [provider]  Hypromellose (ARTIFICIAL TEARS OP) Apply 1 drop to eye daily as needed (Dry eyes).    [provider]  ibuprofen (ADVIL) 800 MG tablet Take 1 tablet (800 mg total) by mouth 3 (three) times daily. 12/07/20   Wieters, Hallie C, PA-C  Multiple Vitamin (MULTIVITAMIN WITH MINERALS) TABS tablet Take 1 tablet by mouth daily.    [provider]  NASAL SALINE NA Place 1 spray into the nose daily as needed (Nasal congestion, allergies).    [provider]  oxymetazoline (AFRIN) 0.05 % nasal spray Place 1 spray into both nostrils daily as needed for congestion.    [provider]    Allergies  Patient has no known allergies.  Review of Systems   Review of Systems  Constitutional:  Negative for fever.  Eyes:  Positive for photophobia.  Respiratory:  Positive for shortness of breath.   Cardiovascular:  Positive for chest pain.  Gastrointestinal:  Positive for nausea and vomiting. Negative for abdominal pain.  Musculoskeletal:        Arm pain  Neurological:  Positive for numbness and headaches. Negative for dizziness, speech difficulty and weakness.   Physical Exam Updated Vital Signs BP 105/66   Pulse 69   Temp 98.4 F (36.9 C) (Oral)   Resp (!) 21   Ht 5\' 1"  (1.549 m)   Wt 59.4 kg   SpO2 95%   BMI 24.75 kg/m   Physical Exam Vitals and nursing note reviewed. Exam conducted with  a chaperone present.  Constitutional:      General: She is not in acute distress.    Appearance: Normal appearance.  HENT:     Head: Normocephalic and atraumatic.  Eyes:     General: No scleral icterus.    Extraocular Movements: Extraocular movements intact.     Pupils: Pupils are equal, round, and reactive to light.  Cardiovascular:     Rate and Rhythm: Normal rate and regular rhythm.     Pulses: Normal pulses.     Heart sounds: Normal heart sounds.     Comments: Radial, DP, PT 2+ bilaterally  Pulmonary:     Effort: Pulmonary effort is normal.  Musculoskeletal:        General: Signs of injury present. No deformity. Normal range of motion.     Comments: Right arm is in splint.  Left arm does not have any tenderness to palpation.  Full range of motion.  Legs are roughly symmetric, there is no pitting edema or unilateral swelling.  No calf tenderness.  Skin:    Capillary Refill: Capillary refill takes less than 2 seconds.     Coloration: Skin is not jaundiced.  Neurological:     Mental Status: She is alert. Mental status is at baseline.     Coordination: Coordination normal.     Comments: Patient has numbness to the left distal aspect of the fifth finger.  She has sensation to touch, feels the pressure but states it feels different than on the right.  She is oriented, answers questions and follows commands appropriately.  Cranial nerves III through XII are grossly intact, [place] is roughly equal bilaterally but weaker on the right side where she recently had surgery.  Lower extremity strength is equal bilaterally, sensation is grossly intact.  Negative pronator.    ED Results / Procedures / Treatments   Labs (all labs ordered are listed, but only abnormal results are displayed) Labs Reviewed  COMPREHENSIVE METABOLIC PANEL - Abnormal; Notable for the following components:      Result Value   Sodium 126 (*)    Chloride 91 (*)    Glucose, Bld 106 (*)    Creatinine, Ser 0.43 (*)     Total Bilirubin 0.1 (*)    All other components within normal limits  CBC WITH DIFFERENTIAL/PLATELET - Abnormal; Notable for the following components:   RBC 3.75 (*)    MCH 35.7 (*)    MCHC 36.4 (*)    All other components within normal limits  RESP PANEL BY RT-PCR (FLU A&B, COVID) [id number]  TROPONIN I (HIGH SENSITIVITY)    EKG None  Radiology DG Chest 2 View  Result Date: 01/19/2021 CLINICAL DATA:  Left arm pain EXAM: CHEST - 2 VIEW COMPARISON:  12/20/2017 FINDINGS: Heart and mediastinal contours are within normal limits. No focal opacities or effusions. No acute bony abnormality. IMPRESSION: No active cardiopulmonary disease. Electronically Signed   By: Charlett Nose M.D.   On: 01/19/2021 18:31   CT Head Wo Contrast  Result Date: 01/19/2021 CLINICAL DATA:  Headache EXAM: CT HEAD WITHOUT CONTRAST TECHNIQUE: Contiguous axial images were obtained from the base of the skull through the vertex without intravenous contrast. COMPARISON:  08/26/2018 FINDINGS: Brain: Old left frontal and frontoparietal infarcts, unchanged. No acute intracranial abnormality. Specifically, no hemorrhage, hydrocephalus, mass lesion, acute infarction, or significant intracranial injury. Vascular: Prior left ICA aneurysm coiling again noted. No hyperdense vessels. Skull: No acute calvarial abnormality. 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 Prior left ICA aneurysm coiling. No acute intracranial abnormality. Electronically Signed   By: Charlett Nose M.D.   On: 01/19/2021 18:31    Procedures Procedures   Medications Ordered in ED Medications  metoCLOPramide (REGLAN) injection 10 mg (10 mg Intravenous Given 01/19/21 1759)  diphenhydrAMINE (BENADRYL) injection 50 mg (50 mg Intravenous Given 01/19/21 1759)  sodium chloride 0.9 % bolus 1,000 mL ( Intravenous Restarted 01/19/21 1826)    ED Course  I have reviewed the triage vital signs and the nursing  notes.  Pertinent labs & imaging results that were available during my care of the patient were reviewed by me and considered in my medical decision making (see chart for details).  Clinical Course as of 01/19/21 2011  Sun Jan 19, 2021  6045 Troponin I (High Sensitivity) Low troponin, this and the vagueness of the chest pain as well as the occurrence over 12 hours ago makes ACS less likely.  Do not think we need a second troponin to compare with, her heart score is 2. [HS]  1855 DG Chest 2 View No pneumonia, no cardiomegaly, no pleural effusion [HS]  1855 DG Chest 2 View No acute changes, no SAH [HS]  1855 CBC with Differential(!) No leukocytosis, no significant anemia. [HS]  1859 Sodium(!): 126 Patient states she has been feeling very weak and dizzy since the surgery cool, stated she has had poor appetite with multiple rounds of emesis since.  The hyponatremia is likely the cause, her NA was 137 on 11/15/20. [HS]    Clinical Course User Index [HS] Theron Arista, PA-C   MDM Rules/Calculators/A&P                           Patient vitals are stable, she is nontoxic-appearing.  No focal deficits on exam, patient does appear tired and dry.  Given that she had a recent surgery and is having multiple bouts of emesis, there is a concern for postoperative complications.  She is not febrile with any overt signs of infection.  Cap refill and radial pulse of right arm are all reassuring.  Doubt compartment syndrome or cellulitis. Atelectasis or PNA on ddx, although lungs were CTA and she is not hypoxic or in respiratory distress on RA.  She has a broad range of risk factors and symptoms.  Ddx includes (but not limited to) ACS, pneumonia, electrolyte derangement, anemia, SAH, TIA, atypical migraine, neuropathy. PE is on the differential given the most recent surgery, she is not hypoxic or tachycardic.  We Sarah hold off on CTA to evaluate for other potential sources of her presentation.  Reevaluation:  Patient reports that her migraine  is improving, although not entirely resolved.  She denies any nausea at this time.    Work-up shows the patient is hyponatremic to 126.  Patient states she has a history of this 2 years ago that required hospitalization.    Patient Sarah need to be admitted for hyponatremia and postop complications including nausea, vomiting, weakness, dizziness.   Hospitalist has not yet returned my 2 pages.  Care is transferred to Army Melia, PA-C who Sarah follow the patient care.   Final diagnoses:  Chest pain    Rx / DC Orders ED Discharge Orders     None        Theron Arista, Cordelia Poche 01/19/21 2014    Arby Barrette, MD 02/16/21 1536

## 2021-01-20 ENCOUNTER — Encounter (HOSPITAL_BASED_OUTPATIENT_CLINIC_OR_DEPARTMENT_OTHER): Payer: Self-pay | Admitting: Family Medicine

## 2021-01-20 DIAGNOSIS — Z8673 Personal history of transient ischemic attack (TIA), and cerebral infarction without residual deficits: Secondary | ICD-10-CM | POA: Diagnosis not present

## 2021-01-20 DIAGNOSIS — Z20822 Contact with and (suspected) exposure to covid-19: Secondary | ICD-10-CM | POA: Diagnosis not present

## 2021-01-20 DIAGNOSIS — I10 Essential (primary) hypertension: Secondary | ICD-10-CM

## 2021-01-20 DIAGNOSIS — E871 Hypo-osmolality and hyponatremia: Secondary | ICD-10-CM

## 2021-01-20 DIAGNOSIS — Z87891 Personal history of nicotine dependence: Secondary | ICD-10-CM | POA: Diagnosis not present

## 2021-01-20 DIAGNOSIS — R0789 Other chest pain: Secondary | ICD-10-CM | POA: Diagnosis not present

## 2021-01-20 DIAGNOSIS — R0602 Shortness of breath: Secondary | ICD-10-CM | POA: Diagnosis not present

## 2021-01-20 DIAGNOSIS — Z7982 Long term (current) use of aspirin: Secondary | ICD-10-CM | POA: Diagnosis not present

## 2021-01-20 DIAGNOSIS — R079 Chest pain, unspecified: Secondary | ICD-10-CM

## 2021-01-20 DIAGNOSIS — E876 Hypokalemia: Secondary | ICD-10-CM | POA: Diagnosis not present

## 2021-01-20 DIAGNOSIS — M79602 Pain in left arm: Secondary | ICD-10-CM | POA: Diagnosis present

## 2021-01-20 DIAGNOSIS — Z79899 Other long term (current) drug therapy: Secondary | ICD-10-CM | POA: Diagnosis not present

## 2021-01-20 DIAGNOSIS — R202 Paresthesia of skin: Secondary | ICD-10-CM

## 2021-01-20 LAB — GLUCOSE, CAPILLARY
Glucose-Capillary: 111 mg/dL — ABNORMAL HIGH (ref 70–99)
Glucose-Capillary: 104 mg/dL — ABNORMAL HIGH (ref 70–99)
Glucose-Capillary: 107 mg/dL — ABNORMAL HIGH (ref 70–99)
Glucose-Capillary: 108 mg/dL — ABNORMAL HIGH (ref 70–99)

## 2021-01-20 LAB — VITAMIN B12: Vitamin B-12: 469 pg/mL (ref 180–914)

## 2021-01-20 LAB — TSH
TSH: 0.572 u[IU]/mL (ref 0.350–4.500)
TSH: 0.313 u[IU]/mL — ABNORMAL LOW (ref 0.350–4.500)

## 2021-01-20 LAB — BASIC METABOLIC PANEL
Anion gap: 7 (ref 5–15)
BUN: 11 mg/dL (ref 8–23)
CO2: 26 mmol/L (ref 22–32)
Calcium: 8.5 mg/dL — ABNORMAL LOW (ref 8.9–10.3)
Chloride: 98 mmol/L (ref 98–111)
Creatinine, Ser: 0.52 mg/dL (ref 0.44–1.00)
GFR, Estimated: >60 mL/min (ref >60)
Glucose, Bld: 99 mg/dL (ref 70–99)
Potassium: 4.1 mmol/L (ref 3.5–5.1)
Sodium: 131 mmol/L — ABNORMAL LOW (ref 135–145)

## 2021-01-20 LAB — OSMOLALITY: Osmolality: 276 mOsm/kg (ref 275–295)

## 2021-01-20 LAB — HIV ANTIBODY (ROUTINE TESTING W REFLEX): HIV Screen 4th Generation wRfx: NONREACTIVE

## 2021-01-20 LAB — BRAIN NATRIURETIC PEPTIDE: B Natriuretic Peptide: 50.8 pg/mL (ref 0.0–100.0)

## 2021-01-20 LAB — CREATININE, URINE, RANDOM: Creatinine, Urine: 15.69 mg/dL

## 2021-01-20 LAB — MAGNESIUM: Magnesium: 1.7 mg/dL (ref 1.7–2.4)

## 2021-01-20 LAB — OSMOLALITY, URINE: Osmolality, Ur: 202 mOsm/kg — ABNORMAL LOW (ref 300–900)

## 2021-01-20 LAB — URIC ACID: Uric Acid, Serum: 2.8 mg/dL (ref 2.5–7.1)

## 2021-01-20 LAB — TROPONIN I (HIGH SENSITIVITY)
Troponin I (High Sensitivity): 5 ng/L (ref <18)
Troponin I (High Sensitivity): 5 ng/L (ref <18)

## 2021-01-20 LAB — MRSA NEXT GEN BY PCR, NASAL: MRSA by PCR Next Gen: DETECTED — AB

## 2021-01-20 LAB — SODIUM, URINE, RANDOM: Sodium, Ur: 57 mmol/L

## 2021-01-20 MED ORDER — SODIUM CHLORIDE 0.9 % IV SOLN: Freq: Once | INTRAVENOUS | Status: AC

## 2021-01-20 MED ORDER — ONDANSETRON HCL 4 MG/2ML IJ SOLN
4.0000 mg | INTRAMUSCULAR | Status: DC | PRN
  Administered 2021-01-20 – 2021-01-21 (×4): 4 mg via INTRAVENOUS
  Filled 2021-01-20 (×4): qty 2

## 2021-01-20 MED ORDER — ACETAMINOPHEN 325 MG PO TABS
650.0000 mg | ORAL_TABLET | ORAL | Status: DC | PRN
  Administered 2021-01-20: 650 mg via ORAL
  Filled 2021-01-20: qty 2

## 2021-01-20 MED ORDER — ACETAMINOPHEN 325 MG PO TABS
650.0000 mg | ORAL_TABLET | Freq: Four times a day (QID) | ORAL | Status: DC | PRN
  Administered 2021-01-21: 650 mg via ORAL
  Filled 2021-01-20: qty 2

## 2021-01-20 MED ORDER — HYDRALAZINE HCL 20 MG/ML IJ SOLN
10.0000 mg | Freq: Four times a day (QID) | INTRAMUSCULAR | Status: DC | PRN
  Administered 2021-01-21: 10 mg via INTRAVENOUS
  Filled 2021-01-20: qty 1

## 2021-01-20 MED ORDER — LORAZEPAM 2 MG/ML IJ SOLN: 1.0000 mg | Freq: Once | INTRAMUSCULAR | Status: DC | PRN

## 2021-01-20 MED ORDER — SUMATRIPTAN SUCCINATE 25 MG PO TABS
25.0000 mg | ORAL_TABLET | Freq: Every day | ORAL | Status: DC | PRN
  Administered 2021-01-20 (×2): 25 mg via ORAL
  Filled 2021-01-20 (×3): qty 1

## 2021-01-20 MED ORDER — AMLODIPINE BESYLATE 10 MG PO TABS: 10.0000 mg | ORAL_TABLET | Freq: Every day | ORAL | Status: DC

## 2021-01-20 MED ORDER — CHLORHEXIDINE GLUCONATE CLOTH 2 % EX PADS
6.0000 | MEDICATED_PAD | Freq: Every day | CUTANEOUS | Status: DC
  Administered 2021-01-20 – 2021-01-21 (×2): 6 via TOPICAL

## 2021-01-20 MED ORDER — OXYCODONE-ACETAMINOPHEN 5-325 MG PO TABS
1.0000 | ORAL_TABLET | Freq: Four times a day (QID) | ORAL | Status: DC | PRN
  Administered 2021-01-20 (×2): 2 via ORAL
  Filled 2021-01-20 (×2): qty 2

## 2021-01-20 MED ORDER — TRAZODONE HCL 50 MG PO TABS
25.0000 mg | ORAL_TABLET | Freq: Every evening | ORAL | Status: DC | PRN
  Administered 2021-01-20: 25 mg via ORAL
  Filled 2021-01-20: qty 1

## 2021-01-20 MED ORDER — PANTOPRAZOLE SODIUM 40 MG IV SOLR
40.0000 mg | Freq: Once | INTRAVENOUS | Status: AC
  Administered 2021-01-20: 40 mg via INTRAVENOUS
  Filled 2021-01-20: qty 40

## 2021-01-20 MED ORDER — SODIUM CHLORIDE 0.9 % IV SOLN: INTRAVENOUS | Status: AC

## 2021-01-20 MED ORDER — ENOXAPARIN SODIUM 40 MG/0.4ML IJ SOSY
40.0000 mg | PREFILLED_SYRINGE | INTRAMUSCULAR | Status: DC
  Administered 2021-01-20 – 2021-01-21 (×2): 40 mg via SUBCUTANEOUS
  Filled 2021-01-20 (×2): qty 0.4

## 2021-01-20 MED ORDER — LABETALOL HCL 5 MG/ML IV SOLN: 10.0000 mg | INTRAVENOUS | Status: DC | PRN

## 2021-01-20 MED ORDER — LISINOPRIL 20 MG PO TABS: 40.0000 mg | ORAL_TABLET | Freq: Every day | ORAL | Status: DC

## 2021-01-20 MED ORDER — OXYCODONE-ACETAMINOPHEN 5-325 MG PO TABS
1.0000 | ORAL_TABLET | Freq: Three times a day (TID) | ORAL | Status: DC | PRN
  Administered 2021-01-20 – 2021-01-21 (×2): 1 via ORAL
  Filled 2021-01-20 (×2): qty 1

## 2021-01-20 MED ORDER — MUPIROCIN 2 % EX OINT
1.0000 "application " | TOPICAL_OINTMENT | Freq: Two times a day (BID) | CUTANEOUS | Status: DC
  Administered 2021-01-20 – 2021-01-21 (×2): 1 via NASAL
  Filled 2021-01-20: qty 22

## 2021-01-20 NOTE — Progress Notes (Addendum)
PROGRESS NOTE                                                                                                                                                                                                             Patient Demographics:    Sarah Kelly, is a 60 y.o. female, DOB - 04-Jun-1961, TXM:[id number]  Outpatient Primary MD for the patient is Nino Glow, PA-C    LOS - 0  Admit date - 01/19/2021    Chief Complaint  Patient presents with   Arm Pain       Brief Narrative (HPI from H&P)  - Sarah Kelly is a 60 y.o. female with medical history significant of HTN, migraine headaches, stroke. Pt presents to ED with c/o L arm pain.  Had tingling and numbness to Pinky of L arm.  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  She was admitted to the hospital for evaluation of left arm tingling numbness.   Subjective:    Sarah Kelly today has, mild routine migraine like headache, No chest pain, No abdominal pain - No Nausea, No new weakness tingling or numbness, no SOB, 1-2 week H/O L arm and L 5th finger numbness.   Assessment  & Plan :     L Arm and 5th digit numbness x 2 weeks  - this does not seem to be related to chest discomfort, she says she had right wrist surgery and she has been having some left arm discomfort and numbness ever since then, since she has previous history of left MCA coiling she cannot undergo MRI, will request neurology to evaluate and provide their opinion.  Question if this is ulnar nerve injury versus C-spine neuropathy.    2.  Hypertension upon admission.  Stable right now we will monitor off Meds.  3.  History of migraines states she takes Imitrex at home we will give a dose of Imitrex due to mild  headache.  4.  Hypokalemia due to dehydration.  Improving with IV fluids continue IV fluids gently.  We will also check osmolality serum, urine sodium and osmolality.  5. ? Chest pain in ER -  EKG non acute, pain free, Trop -ve x 3. Monitor.       Condition - Fair  Family Communication  :  None present  Code Status :  Full  Consults  :  Neuro  PUD Prophylaxis : None   Procedures  :     CT head - non acute      Disposition Plan  :    Status is: Observation    Dispo: The patient is from: Home              Anticipated d/c is to: Home              Patient currently is not medically stable to d/c.   Difficult to place patient No  DVT Prophylaxis  :    enoxaparin (LOVENOX) injection 40 mg Start: 01/20/21 0600    Lab Results  Component Value Date   PLT 357 01/19/2021    Diet :  Diet Order             DIET SOFT Room service appropriate? Yes; Fluid consistency: Thin  Diet effective now                    Inpatient Medications  Scheduled Meds:  Chlorhexidine Gluconate Cloth  6 each Topical Q0600   enoxaparin (LOVENOX) injection  40 mg Subcutaneous Q24H   mupirocin ointment  1 application Nasal BID   Continuous Infusions:  sodium chloride 100 mL/hr at 01/20/21 0810   PRN Meds:.acetaminophen, hydrALAZINE, labetalol, ondansetron (ZOFRAN) IV, oxyCODONE-acetaminophen, SUMAtriptan  Antibiotics  :    Anti-infectives (From admission, onward)    None        Time Spent in minutes  30   Susa Raring M.D on 01/20/2021 at 9:58 AM  To page go to [email or web address]   [facility] -  Office  [phone]    See all Orders from today for further details    Objective:   Vitals:   01/20/21 0000 01/20/21 0100 01/20/21 0224 01/20/21 0400  BP: 114/67 125/60 (!) 147/86 107/61  Pulse: 65 70 78 64  Resp: (!) 24 12 20 18   Temp: 98.9 F (37.2 C)  98.7 F (37.1 C) 98.9 F (37.2 C)  TempSrc: Oral  Oral Oral  SpO2: 97% 94% 97% 93%  Weight:       Height:        Wt Readings from Last 3 Encounters:  01/19/21 59.4 kg  10/13/14 57.6 kg    No intake or output data in the 24 hours ending 01/20/21 0958   Physical Exam  Awake Alert, No new F.N deficits, L.5 th finger numbness some in L arm as well [place].AT,PERRAL Supple Neck,No JVD, No cervical lymphadenopathy appriciated.  Symmetrical Chest wall movement, Good air movement bilaterally, CTAB RRR,No Gallops,Rubs or new Murmurs, No Parasternal Heave +ve B.Sounds, Abd Soft, No tenderness, No organomegaly appriciated, No rebound - guarding or rigidity. No Cyanosis, Clubbing or edema, No new Rash or bruise      Data Review:    CBC Recent Labs  Lab 01/19/21 1750  WBC 6.6  HGB 13.4  HCT 36.8  PLT 357  MCV 98.1  MCH 35.7*  MCHC 36.4*  RDW 11.7  LYMPHSABS 1.5  MONOABS 0.7  EOSABS 0.1  BASOSABS 0.0    Recent Labs  Lab 01/19/21 1750 01/20/21 0341 01/20/21 0741  NA 126* 131*  --   K 4.2 4.1  --   CL 91* 98  --   CO2  27 26  --   GLUCOSE 106* 99  --   BUN 12 11  --   CREATININE 0.43* 0.52  --   CALCIUM 8.9 8.5*  --   AST 28  --   --   ALT 28  --   --   ALKPHOS 104  --   --   BILITOT 0.1*  --   --   ALBUMIN 3.9  --   --   MG  --   --  1.7  BNP  --   --  50.8    ------------------------------------------------------------------------------------------------------------------ No results for input(s): CHOL, HDL, LDLCALC, TRIG, CHOLHDL, LDLDIRECT in the last 72 hours.  Lab Results  Component Value Date   HGBA1C  12/24/2009    5.4 (NOTE)                                                                       According to the ADA Clinical Practice Recommendations for 2011, when HbA1c is used as a screening test:   >=6.5%   Diagnostic of Diabetes Mellitus           (if abnormal result  is confirmed)  5.7-6.4%   Increased risk of developing Diabetes Mellitus  References:Diagnosis and Classification of Diabetes Mellitus,Diabetes Care,2011,34(Suppl 1):S62-S69 and Standards  of Medical Care in         Diabetes - 2011,Diabetes Care,2011,34  (Suppl 1):S11-S61.   ------------------------------------------------------------------------------------------------------------------ No results for input(s): TSH, T4TOTAL, T3FREE, THYROIDAB in the last 72 hours.  Invalid input(s): FREET3  Cardiac Enzymes No results for input(s): CKMB, TROPONINI, MYOGLOBIN in the last 168 hours.  Invalid input(s): CK ------------------------------------------------------------------------------------------------------------------    Component Value Date/Time   BNP 50.8 01/20/2021 0741     Radiology Reports DG Chest 2 View  Result Date: 01/19/2021 CLINICAL DATA:  Left arm pain EXAM: CHEST - 2 VIEW COMPARISON:  12/20/2017 FINDINGS: Heart and mediastinal contours are within normal limits. No focal opacities or effusions. No acute bony abnormality. IMPRESSION: No active cardiopulmonary disease. Electronically Signed   By: Charlett Nose M.D.   On: 01/19/2021 18:31   CT Head Wo Contrast  Result Date: 01/19/2021 CLINICAL DATA:  Headache EXAM: CT HEAD WITHOUT CONTRAST TECHNIQUE: Contiguous axial images were obtained from the base of the skull through the vertex without intravenous contrast. COMPARISON:  08/26/2018 FINDINGS: Brain: Old left frontal and frontoparietal infarcts, unchanged. No acute intracranial abnormality. Specifically, no hemorrhage, hydrocephalus, mass lesion, acute infarction, or significant intracranial injury. Vascular: Prior left ICA aneurysm coiling again noted. No hyperdense vessels. Skull: No acute calvarial abnormality. 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 Prior left ICA aneurysm coiling. No acute intracranial abnormality. Electronically Signed   By: Charlett Nose M.D.   On: 01/19/2021 18:31

## 2021-01-20 NOTE — Evaluation (Signed)
Physical Therapy Evaluation and Discharge Patient Details Name: Sarah Kelly MRN: [id number] DOB: Nov 09, 1960 Today's Date: 01/20/2021   History of Present Illness  Pt is a 60 y/o female admitted 8/7 secondary to L arm pain/chest pain. Thought to be secondary to HTN emergency. PMH includes HTN, CVA, and migraines.  Clinical Impression  Patient evaluated by Physical Therapy with no further acute PT needs identified. All education has been completed and the patient has no further questions. Pt overall steady and independent within the room. No overt LOB noted, but did report pain which limited mobility tolerance. Educated about walking program to perform at home and to perform current LUE exercises that outpatient hand therapist has suggested. Recommend returning to outpatient hand specialist at d/c. See below for any follow-up Physical Therapy or equipment needs. PT is signing off. Thank you for this referral. If needs change, please re-consult.      Follow Up Recommendations Other (comment) (continue with outpatient hand therapist)    Equipment Recommendations  None recommended by PT    Recommendations for Other Services       Precautions / Restrictions Precautions Precautions: None Restrictions Weight Bearing Restrictions: No      Mobility  Bed Mobility Overal bed mobility: Independent                  Transfers Overall transfer level: Independent                  Ambulation/Gait Ambulation/Gait assistance: Independent Gait Distance (Feet): 20 Feet Assistive device: None Gait Pattern/deviations: WFL(Within Functional Limits) Gait velocity: WFL   General Gait Details: Pt overall steady with gait in room. No overt LOB noted. Pt reporting increased back pain and requesting to stay in room. Educated about walking program to perform at home to help with back pain  Stairs            Wheelchair Mobility    Modified Rankin (Stroke Patients Only)        Balance Overall balance assessment: No apparent balance deficits (not formally assessed)                                           Pertinent Vitals/Pain Pain Assessment: Faces Faces Pain Scale: Hurts little more Pain Location: back Pain Descriptors / Indicators: Aching;Grimacing;Guarding Pain Intervention(s): Limited activity within patient's tolerance;Monitored during session;Repositioned    Home Living Family/patient expects to be discharged to:: Private residence Living Arrangements: Alone Available Help at Discharge: Friend(s);Available PRN/intermittently Type of Home: House Home Access: Stairs to enter Entrance Stairs-Rails: None Entrance Stairs-Number of Steps: 3 Home Layout: One level Home Equipment: None      Prior Function Level of Independence: Independent         Comments: Owns a horse training business     Hand Dominance        Extremity/Trunk Assessment   Upper Extremity Assessment Upper Extremity Assessment: LUE deficits/detail LUE Deficits / Details: Reporting numbness at tip of pinky finger. Reports originally it was throughout whole hand after getting blood drawn, but has since improved.    Lower Extremity Assessment Lower Extremity Assessment: Overall WFL for tasks assessed    Cervical / Trunk Assessment Cervical / Trunk Assessment: Normal  Communication   Communication: No difficulties  Cognition Arousal/Alertness: Awake/alert Behavior During Therapy: WFL for tasks assessed/performed Overall Cognitive Status: Within Functional Limits for tasks assessed  General Comments      Exercises     Assessment/Plan    PT Assessment Patent does not need any further PT services  PT Problem List         PT Treatment Interventions      PT Goals (Current goals can be found in the Care Plan section)  Acute Rehab PT Goals Patient Stated Goal: to go home PT Goal  Formulation: With patient Time For Goal Achievement: 01/20/21 Potential to Achieve Goals: Good    Frequency     Barriers to discharge        Co-evaluation               AM-PAC PT "6 Clicks" Mobility  Outcome Measure Help needed turning from your back to your side while in a flat bed without using bedrails?: None Help needed moving from lying on your back to sitting on the side of a flat bed without using bedrails?: None Help needed moving to and from a bed to a chair (including a wheelchair)?: None Help needed standing up from a chair using your arms (e.g., wheelchair or bedside chair)?: None Help needed to walk in hospital room?: None Help needed climbing 3-5 steps with a railing? : None 6 Click Score: 24    End of Session   Activity Tolerance: Patient limited by pain Patient left: in bed;with call bell/phone within reach Nurse Communication: Mobility status PT Visit Diagnosis: Other symptoms and signs involving the nervous system (R29.898)    Time: 1355-1411 PT Time Calculation (min) (ACUTE ONLY): 16 min   Charges:   PT Evaluation $PT Eval Low Complexity: 1 Low          Cindee Salt, DPT  Acute Rehabilitation Services  Pager: [phone] Office: [phone]   Sarah Kelly 01/20/2021, 2:23 PM

## 2021-01-20 NOTE — Consult Note (Signed)
Neurology Consultation  Reason for Consult: Left 5th digit numbness Referring Physician: Dr. Candiss Norse  CC: Left pinky numbness  History is obtained from: Patient, Chart review  HPI: Sarah Kelly is a 60 y.o. female with a medical history significant for anxiety, L5 fracture, hypertension, migraine headaches, and strokes, and previous left ICA coiling who presented to the ED 01/19/2021 for evaluation of left arm pain with numbness of the left 5th digit for approximately 2 weeks (reported since her right wrist surgery on 12/09/2020). She states that since her surgery, her 5th digit on her left hand initially felt like she shut it inside of a drawer with a pinching like feeling and since then it has progressively become more "numb and annoying" with constant numbness. There seems to be variability in her reporting between providers and within each evaluation as she states that it has been 2 weeks of numbness though her surgery was 6 weeks ago. The numbness is reported to be on the dorsal pinky from the tip distally with normal sensation returning just above the PIP joint with numbness also on the plantar surface only involving the fingernail. She denies any lateral extremity involvement of her numbness. She denies any trauma to the extremity. She has not had any associated weakness or loss of function of the digit since the onset of her sensory deficits. She states that earlier today, the numbness progressed to the level of the wrist and lasted for approximately 20 minutes before returning to only involve the pinky. She also endorses a migraine headache on the top of her head, improved from this morning to an 8/10 in severity without associated nausea or vomiting.   ROS: A complete ROS was performed and is negative except as noted in the HPI.   Past Medical History:  Diagnosis Date   Anxiety    Back pain    Concussion    Fracture of lumbar vertebra (HCC) L 5 fracture   Hypertension    Migraine     Stroke [facility])    Past Surgical History:  Procedure Laterality Date   BREAST SURGERY     TONSILECTOMY/ADENOIDECTOMY WITH MYRINGOTOMY     Family History  Problem Relation Age of Onset   Cancer Father    Heart disease Paternal Grandfather    Social History:   reports that she has quit smoking. She does not have any smokeless tobacco history on file. She reports current alcohol use. She reports that she does not use drugs.  Medications  Current Facility-Administered Medications:    0.9 %  sodium chloride infusion, , Intravenous, Continuous, Thurnell Lose, MD, Last Rate: 100 mL/hr at 01/20/21 0810, Rate Change at 01/20/21 0810   acetaminophen (TYLENOL) tablet 650 mg, 650 mg, Oral, Q6H PRN, Thurnell Lose, MD   Chlorhexidine Gluconate Cloth 2 % PADS 6 each, 6 each, Topical, Q0600, Thurnell Lose, MD, 6 each at 01/20/21 0903   enoxaparin (LOVENOX) injection 40 mg, 40 mg, Subcutaneous, Q24H, Alcario Drought, Jared M, DO, 40 mg at 01/20/21 1017   hydrALAZINE (APRESOLINE) injection 10 mg, 10 mg, Intravenous, Q6H PRN, Thurnell Lose, MD   labetalol (NORMODYNE) injection 10-20 mg, 10-20 mg, Intravenous, Q2H PRN, Alcario Drought, Jared M, DO   LORazepam (ATIVAN) injection 1 mg, 1 mg, Intravenous, Once PRN, Thurnell Lose, MD   mupirocin ointment (BACTROBAN) 2 % 1 application, 1 application, Nasal, BID, Thurnell Lose, MD, 1 application at 51/02/58 0953   ondansetron (ZOFRAN) injection 4 mg, 4 mg, Intravenous, Q4H PRN,  Charlesetta Shanks, MD, 4 mg at 01/20/21 0737   oxyCODONE-acetaminophen (PERCOCET/ROXICET) 5-325 MG per tablet 1 tablet, 1 tablet, Oral, Q8H PRN, Thurnell Lose, MD   SUMAtriptan (IMITREX) tablet 25 mg, 25 mg, Oral, Daily PRN, Thurnell Lose, MD, 25 mg at 01/20/21 1108  Exam: Current vital signs: BP 120/69 (BP Location: Right Arm)   Pulse 66   Temp 98.7 F (37.1 C) (Oral)   Resp 18   Ht _0  (1.549 m)   Wt 59.4 kg   SpO2 94%   BMI 24.75 kg/m  Vital signs in last 24  hours: Temp:  [98.4 F (36.9 C)-98.9 F (37.2 C)] 98.7 F (37.1 C) (08/08 0800) Pulse Rate:  [64-87] 66 (08/08 1100) Resp:  [12-24] 18 (08/08 1100) BP: (93-167)/(60-86) 120/69 (08/08 1100) SpO2:  [92 %-98 %] 94 % (08/08 0800) Weight:  [59.4 kg] 59.4 kg (08/07 1652)  GENERAL: Awake, alert, sitting in a dark room, in no acute distress Psych: Calm and cooperative with examination Head: Normocephalic and atraumatic without obvious abnormality EENT: Poor dentition, dry mm, no OP obstruction LUNGS: Normal respiratory effort. Non-labored breathing. CV: Regular rate and rhythm on cardiac monitor ABDOMEN - Soft, non-tender, n'on-distended Ext: warm, well perfused, without obvious deformity  NEURO:  Mental Status: Awake, alert, and oriented to person, place, time, and situation. She is able to provide a clear and coherent history of present illness.  Speech/Language: speech is intact without dysarthria.  Naming, repetition, fluency, and comprehension intact without aphasia. No neglect is noted.  Cranial Nerves:  II: PERRL. Visual fields full.  III, IV, VI: EOMI without ptosis or nystagmus V: Sensation is intact to light touch and symmetrical to face.  VII: Face is symmetric resting and smiling.  VIII: Hearing is intact to voice IX, X: Palate elevation is symmetric. Phonation normal.  XI: Normal sternocleidomastoid and trapezius muscle strength XII: Tongue protrudes midline without fasciculations.   Motor: 5/5 strength is all muscle groups without vertical drift, no asymmetry noted.  Left hand digit adduction and abduction without weakness. Wrist strength with 5/5 weakness. Tone is normal. Bulk is normal.  Sensation: Intact to light touch bilaterally in all four extremities.  Left dorsal 5th digit numbness from tip proximally to just above the PIP joint and plantar fingernail area of involvement of numbness. Coordination: FTN intact bilaterally. HKS intact bilaterally. No pronator drift.   DTRs: 2+ and symmetric biceps Gait: Deferred  Labs I have reviewed labs in epic and the results pertinent to this consultation are: CBC    Component Value Date/Time   WBC 6.6 01/19/2021 1750   RBC 3.75 (L) 01/19/2021 1750   HGB 13.4 01/19/2021 1750   HCT 36.8 01/19/2021 1750   PLT 357 01/19/2021 1750   MCV 98.1 01/19/2021 1750   MCH 35.7 (H) 01/19/2021 1750   MCHC 36.4 (H) 01/19/2021 1750   RDW 11.7 01/19/2021 1750   LYMPHSABS 1.5 01/19/2021 1750   MONOABS 0.7 01/19/2021 1750   EOSABS 0.1 01/19/2021 1750   BASOSABS 0.0 01/19/2021 1750   CMP     Component Value Date/Time   NA 131 (L) 01/20/2021 0341   K 4.1 01/20/2021 0341   CL 98 01/20/2021 0341   CO2 26 01/20/2021 0341   GLUCOSE 99 01/20/2021 0341   BUN 11 01/20/2021 0341   CREATININE 0.52 01/20/2021 0341   CALCIUM 8.5 (L) 01/20/2021 0341   PROT 7.0 01/19/2021 1750   ALBUMIN 3.9 01/19/2021 1750   AST 28 01/19/2021 1750  ALT 28 01/19/2021 1750   ALKPHOS 104 01/19/2021 1750   BILITOT 0.1 (L) 01/19/2021 1750   GFRNONAA >60 01/20/2021 0341   GFRAA  12/24/2009 1723    >60        The eGFR has been calculated using the MDRD equation. This calculation has not been validated in all clinical situations. eGFR's persistently <60 mL/min signify possible Chronic Kidney Disease.   Lipid Panel  No results found for: CHOL, TRIG, HDL, CHOLHDL, VLDL, LDLCALC, LDLDIRECT  Imaging I have reviewed the images obtained:  CT-scan of the brain 01/19/2021: Old left cerebral hemisphere infarcts. Prior left ICA aneurysm coiling. No acute intracranial abnormality.  Assessment: 60 y.o. female who presented to the ED 01/19/2021 for evaluation of left pinky numbness, intermittent emesis, weakness, and dizziness since closed Coley fracture repair on 6/27 of right arm.  - Examination reveals patient with complaints of left dorsal 5th digit numbness from tip proximally to just above the PIP joint and plantar fingernail area of involvement of  numbness without functional limitation of the digit.  - Reports of timing, onset, and involvement seem to have some inconsistencies between reporting to different providers.   - Patient's complaints are variable and do not follow any specific dermatomal distribution for localization and would defer further inpatient work up at this time.   Recommendations: - Can consider outpatient EMG/NCS if numbness persists or progresses - No further inpatient neurology recommendations at this time  - Vitamin B12, TSH, folate levels -Discussed the possibility of MRI but exam is not very suggestive of a stroke and patient would like not to get the MRI done.   Pt seen by NP/Neuro and later by MD. Note/plan to be edited by MD as needed.  Anibal Henderson, AGAC-NP [facility] Pager: [phone]   Attending Neurohospitalist Addendum Patient seen and examined with APP/Resident. Agree with the history and physical as documented above. Agree with the plan as documented, which I helped formulate. I have independently reviewed the chart, obtained history, review of systems and examined the patient.I have personally reviewed pertinent head/neck/spine imaging (CT/MRI). Please feel free to call with any questions. --- Amie Portland, MD [facility] Pager: [phone]  If 7pm to 7am, please call on call as listed on AMION.

## 2021-01-20 NOTE — H&P (Addendum)
History and Physical    Sarah Kelly DOB: [birth year] DOA: 01/19/2021  PCP: Nino Glow, PA-C  Sarah Kelly coming from: Home  I have personally briefly reviewed Sarah Kelly's old medical records in [facility]  Chief Complaint: Arm pain  HPI: Sarah Kelly is a 60 y.o. female with medical history significant of HTN, migraine headaches, stroke.  Pt presents to ED with c/o L arm pain.  Had tingling and numbness to Pinky of L arm.  Onset 4 days ago.  Constant, not past the PIP joint.  No trauma to area.  Also feels like she was punched in upper left arm.  She also endorses this morning she woke up from a severe sudden onset of a headache.  She checked her blood pressure and it was 190/98.  She took her blood pressure medicine. There is associated nausea and one episode of emesis. She had about 20 minutes of chest pain that resolved on its own and she returned back to sleep.  No history of MI, no history of PE.  Sarah Kelly had a closed Coley fracture repair on 6/27 to the right arm.  She has been having intermittent episodes of emesis, weakness, dizziness since then.    Symptoms have been associated with very elevated BPs at home including SBPs as high as 190s systolic.  Took extra half doses of amlodipine and lisinopril before going to ED tonight.  No SOB, no abd pain.   ED Course: Sodium 126.  Hypertensive to 167/86.  Given reglan, toradol, benadryl.  Given 1L bolus and 2nd L going at 125 cc/hr.  BP has improved throughout ED stay and arm pain has resolved.   Review of Systems: As per HPI, otherwise all review of systems negative.  Past Medical History:  Diagnosis Date   Anxiety    Back pain    Concussion    Fracture of lumbar vertebra (HCC) L 5 fracture   Hypertension    Migraine    Stroke [facility])     Past Surgical History:  Procedure Laterality Date   BREAST SURGERY     TONSILECTOMY/ADENOIDECTOMY WITH MYRINGOTOMY       reports that she has  quit smoking. She does not have any smokeless tobacco history on file. She reports current alcohol use. She reports that she does not use drugs.  Allergies  Allergen Reactions   Corticosteroids     Agitation and insomnia   Cymbalta [Duloxetine Hcl]    Lexapro [Escitalopram]    Wellbutrin [Bupropion]    Zoloft [Sertraline]    Zonisamide     Family History  Problem Relation Age of Onset   Cancer Father    Heart disease Paternal Grandfather      Prior to Admission medications   Medication Sig Start Date End Date Taking? Authorizing Provider  acetaminophen (TYLENOL) 500 MG tablet Take 500 mg by mouth every 6 (six) hours as needed for headache.    [provider]  aspirin 325 MG EC tablet Take 325 mg by mouth daily as needed for pain (Back pain).    [provider]  Aspirin-Salicylamide-Caffeine (BC HEADACHE POWDER PO) Take 1 packet by mouth daily as needed (Headache).    [provider]  Hypromellose (ARTIFICIAL TEARS OP) Apply 1 drop to eye daily as needed (Dry eyes).    [provider]  ibuprofen (ADVIL) 800 MG tablet Take 1 tablet (800 mg total) by mouth 3 (three) times daily. 12/07/20   Wieters, Hallie C, PA-C  Multiple Vitamin (  MULTIVITAMIN WITH MINERALS) TABS tablet Take 1 tablet by mouth daily.    [provider]  NASAL SALINE NA Place 1 spray into the nose daily as needed (Nasal congestion, allergies).    [provider]  oxymetazoline (AFRIN) 0.05 % nasal spray Place 1 spray into both nostrils daily as needed for congestion.    [provider]    Physical Exam: Vitals:   01/19/21 2100 01/19/21 2200 01/20/21 0000 01/20/21 0100  BP: 112/68 93/68 114/67 125/60  Pulse: 73 71 65 70  Resp: 20 (!) 22 (!) 24 12  Temp:   98.9 F (37.2 C)   TempSrc:   Oral   SpO2: 94% 92% 97% 94%  Weight:      Height:        Constitutional: NAD, calm, comfortable Eyes: PERRL, lids and conjunctivae normal ENMT: Mucous membranes  are moist. Posterior pharynx clear of any exudate or lesions.Normal dentition.  Neck: normal, supple, no masses, no thyromegaly Respiratory: clear to auscultation bilaterally, no wheezing, no crackles. Normal respiratory effort. No accessory muscle use.  Cardiovascular: Regular rate and rhythm, no murmurs / rubs / gallops. No extremity edema. 2+ pedal pulses. No carotid bruits.  Abdomen: no tenderness, no masses palpated. No hepatosplenomegaly. Bowel sounds positive.  Musculoskeletal: no clubbing / cyanosis. No joint deformity upper and lower extremities. Good ROM, no contractures. Normal muscle tone.  L wrist in splint Skin: no rashes, lesions, ulcers. No induration Neurologic: CN 2-12 grossly intact. Sensation intact, DTR normal. Strength 5/5 in all 4.  Psychiatric: Normal judgment and insight. Alert and oriented x 3. Normal mood.    Labs on Admission: I have personally reviewed following labs and imaging studies  CBC: Recent Labs  Lab 01/19/21 1750  WBC 6.6  NEUTROABS 4.2  HGB 13.4  HCT 36.8  MCV 98.1  PLT 357   Basic Metabolic Panel: Recent Labs  Lab 01/19/21 1750  NA 126*  K 4.2  CL 91*  CO2 27  GLUCOSE 106*  BUN 12  CREATININE 0.43*  CALCIUM 8.9   GFR: Estimated Creatinine Clearance: 61.1 mL/min (A) (by C-G formula based on SCr of 0.43 mg/dL (L)). Liver Function Tests: Recent Labs  Lab 01/19/21 1750  AST 28  ALT 28  ALKPHOS 104  BILITOT 0.1*  PROT 7.0  ALBUMIN 3.9   No results for input(s): LIPASE, AMYLASE in the last 168 hours. No results for input(s): AMMONIA in the last 168 hours. Coagulation Profile: No results for input(s): INR, PROTIME in the last 168 hours. Cardiac Enzymes: No results for input(s): CKTOTAL, CKMB, CKMBINDEX, TROPONINI in the last 168 hours. BNP (last 3 results) No results for input(s): PROBNP in the last 8760 hours. HbA1C: No results for input(s): HGBA1C in the last 72 hours. CBG: No results for input(s): GLUCAP in the last 168  hours. Lipid Profile: No results for input(s): CHOL, HDL, LDLCALC, TRIG, CHOLHDL, LDLDIRECT in the last 72 hours. Thyroid Function Tests: No results for input(s): TSH, T4TOTAL, FREET4, T3FREE, THYROIDAB in the last 72 hours. Anemia Panel: No results for input(s): VITAMINB12, FOLATE, FERRITIN, TIBC, IRON, RETICCTPCT in the last 72 hours. Urine analysis:    Component Value Date/Time   COLORURINE YELLOW 12/23/2009 1307   APPEARANCEUR CLEAR 12/23/2009 1307   LABSPEC 1.006 12/23/2009 1307   PHURINE 6.0 12/23/2009 1307   GLUCOSEU NEGATIVE 12/23/2009 1307   HGBUR NEGATIVE 12/23/2009 1307   BILIRUBINUR NEGATIVE 12/23/2009 1307   KETONESUR 15 (A) 12/23/2009 1307   PROTEINUR NEGATIVE 12/23/2009 1307  UROBILINOGEN 0.2 12/23/2009 1307   NITRITE NEGATIVE 12/23/2009 1307   LEUKOCYTESUR  12/23/2009 1307    NEGATIVE MICROSCOPIC NOT DONE ON URINES WITH NEGATIVE PROTEIN, BLOOD, LEUKOCYTES, NITRITE, OR GLUCOSE <1000 mg/dL.    Radiological Exams on Admission: DG Chest 2 View  Result Date: 01/19/2021 CLINICAL DATA:  Left arm pain EXAM: CHEST - 2 VIEW COMPARISON:  12/20/2017 FINDINGS: Heart and mediastinal contours are within normal limits. No focal opacities or effusions. No acute bony abnormality. IMPRESSION: No active cardiopulmonary disease. Electronically Signed   By: Charlett Nose M.D.   On: 01/19/2021 18:31   CT Head Wo Contrast  Result Date: 01/19/2021 CLINICAL DATA:  Headache EXAM: CT HEAD WITHOUT CONTRAST TECHNIQUE: Contiguous axial images were obtained from the base of the skull through the vertex without intravenous contrast. COMPARISON:  08/26/2018 FINDINGS: Brain: Old left frontal and frontoparietal infarcts, unchanged. No acute intracranial abnormality. Specifically, no hemorrhage, hydrocephalus, mass lesion, acute infarction, or significant intracranial injury. Vascular: Prior left ICA aneurysm coiling again noted. No hyperdense vessels. Skull: No acute calvarial abnormality. 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 Prior left ICA aneurysm coiling. No acute intracranial abnormality. Electronically Signed   By: Charlett Nose M.D.   On: 01/19/2021 18:31    EKG: Independently reviewed.  Assessment/Plan Principal Problem:   Chest pain Active Problems:   Hyponatremia   HTN (hypertension)    Arm pain - Think arm / chest pain was most likely secondary to HTN urgency.  Symptoms have resolved with improvement in HTN HEART score = 2-3 CP obs pathway Tele monitor Serial trops NPO (until 2nd trop neg at least). Will defer decision wether to call cards in AM to day team, but more suspicious of HTN urgency at this point. Hyponatremia - Probably secondary to HCTZ Stop HCTZ IVF Repeat BMP in AM HTN - Cont amlodipine and lisinopril Labetalol PRN for now Stopping HCTZ due to hyponatremia Likely will need another agent to go home on  DVT prophylaxis: Lovenox Code Status: Full Family Communication: No family in room Disposition Plan: Home after CP obs Consults called: None Admission status: Place in 61   Lourine Alberico M. DO [facility]  How to contact the [facility] Attending or Consulting provider 7A - 7P or covering provider during after hours 7P -7A, for this Sarah Kelly?  Check the care team in [facility] and look for a) attending/consulting [facility] provider listed and b) the [facility] team listed Log into [email or web address]  Amion Physician Scheduling and messaging for groups and whole hospitals  On call and physician scheduling software for group practices, residents, hospitalists and other medical providers for call, clinic, rotation and shift schedules. On[employer] is a hospital-wide system for scheduling doctors and paging doctors on call. EasyPlot is for scientific plotting and data analysis.  [email or web address]  and use [place]'s universal password to access. If you do not have the password, please contact the hospital operator.   Locate the [facility] provider you are looking for under [facility] and page to a number that you can be directly reached. If you still have difficulty reaching the provider, please page the [facility] (Director on Call) for the Hospitalists listed on amion for assistance.  01/20/2021, 3:23 AM

## 2021-01-21 DIAGNOSIS — R531 Weakness: Secondary | ICD-10-CM | POA: Diagnosis not present

## 2021-01-21 DIAGNOSIS — I10 Essential (primary) hypertension: Secondary | ICD-10-CM | POA: Diagnosis not present

## 2021-01-21 DIAGNOSIS — E871 Hypo-osmolality and hyponatremia: Secondary | ICD-10-CM | POA: Diagnosis not present

## 2021-01-21 LAB — CBC WITH DIFFERENTIAL/PLATELET
Abs Immature Granulocytes: 0.01 10*3/uL (ref 0.00–0.07)
Basophils Absolute: 0 10*3/uL (ref 0.0–0.1)
Basophils Relative: 1 %
Eosinophils Absolute: 0.1 10*3/uL (ref 0.0–0.5)
Eosinophils Relative: 2 %
HCT: 35.6 % — ABNORMAL LOW (ref 36.0–46.0)
Hemoglobin: 12.7 g/dL (ref 12.0–15.0)
Immature Granulocytes: 0 %
Lymphocytes Relative: 26 %
Lymphs Abs: 1.7 10*3/uL (ref 0.7–4.0)
MCH: 35.8 pg — ABNORMAL HIGH (ref 26.0–34.0)
MCHC: 35.7 g/dL (ref 30.0–36.0)
MCV: 100.3 fL — ABNORMAL HIGH (ref 80.0–100.0)
Monocytes Absolute: 0.9 10*3/uL (ref 0.1–1.0)
Monocytes Relative: 13 %
Neutro Abs: 3.7 10*3/uL (ref 1.7–7.7)
Neutrophils Relative %: 58 %
Platelets: 348 10*3/uL (ref 150–400)
RBC: 3.55 MIL/uL — ABNORMAL LOW (ref 3.87–5.11)
RDW: 11.9 % (ref 11.5–15.5)
WBC: 6.3 10*3/uL (ref 4.0–10.5)
nRBC: 0 % (ref 0.0–0.2)

## 2021-01-21 LAB — GLUCOSE, CAPILLARY
Glucose-Capillary: 126 mg/dL — ABNORMAL HIGH (ref 70–99)
Glucose-Capillary: 106 mg/dL — ABNORMAL HIGH (ref 70–99)
Glucose-Capillary: 160 mg/dL — ABNORMAL HIGH (ref 70–99)

## 2021-01-21 LAB — COMPREHENSIVE METABOLIC PANEL
ALT: 24 U/L (ref 0–44)
AST: 27 U/L (ref 15–41)
Albumin: 3.2 g/dL — ABNORMAL LOW (ref 3.5–5.0)
Alkaline Phosphatase: 72 U/L (ref 38–126)
Anion gap: 8 (ref 5–15)
BUN: 8 mg/dL (ref 8–23)
CO2: 25 mmol/L (ref 22–32)
Calcium: 8.6 mg/dL — ABNORMAL LOW (ref 8.9–10.3)
Chloride: 101 mmol/L (ref 98–111)
Creatinine, Ser: 0.47 mg/dL (ref 0.44–1.00)
GFR, Estimated: >60 mL/min (ref >60)
Glucose, Bld: 92 mg/dL (ref 70–99)
Potassium: 3.5 mmol/L (ref 3.5–5.1)
Sodium: 134 mmol/L — ABNORMAL LOW (ref 135–145)
Total Bilirubin: <0.1 mg/dL — ABNORMAL LOW (ref 0.3–1.2)
Total Protein: 5.8 g/dL — ABNORMAL LOW (ref 6.5–8.1)

## 2021-01-21 LAB — MAGNESIUM: Magnesium: 1.6 mg/dL — ABNORMAL LOW (ref 1.7–2.4)

## 2021-01-21 LAB — BRAIN NATRIURETIC PEPTIDE: B Natriuretic Peptide: 176.9 pg/mL — ABNORMAL HIGH (ref 0.0–100.0)

## 2021-01-21 LAB — RPR: RPR Ser Ql: NONREACTIVE

## 2021-01-21 MED ORDER — LOPERAMIDE HCL 2 MG PO CAPS
2.0000 mg | ORAL_CAPSULE | Freq: Four times a day (QID) | ORAL | Status: DC | PRN
  Administered 2021-01-21: 2 mg via ORAL
  Filled 2021-01-21: qty 1

## 2021-01-21 MED ORDER — SUMATRIPTAN SUCCINATE 25 MG PO TABS
25.0000 mg | ORAL_TABLET | Freq: Two times a day (BID) | ORAL | Status: DC | PRN
  Administered 2021-01-21: 25 mg via ORAL
  Filled 2021-01-21 (×2): qty 1

## 2021-01-21 MED ORDER — MAGNESIUM SULFATE 2 GM/50ML IV SOLN
2.0000 g | Freq: Once | INTRAVENOUS | Status: AC
  Administered 2021-01-21: 2 g via INTRAVENOUS
  Filled 2021-01-21: qty 50

## 2021-01-21 MED ORDER — ACETAMINOPHEN 500 MG PO TABS
500.0000 mg | ORAL_TABLET | Freq: Four times a day (QID) | ORAL | Status: DC | PRN
  Administered 2021-01-21 (×2): 500 mg via ORAL
  Filled 2021-01-21 (×2): qty 1

## 2021-01-21 NOTE — Progress Notes (Signed)
RN went over d/c summary with pt and removed pt's PIV. Belongings with pt. Pt's friend to transport pt home. NT transported pt to private vehicle.

## 2021-01-21 NOTE — Progress Notes (Signed)
OT Cancellation Note  Patient Details Name: Sarah Kelly MRN: [id number] DOB: 1961/02/16   Cancelled Treatment:    Reason Eval/Treat Not Completed: OT screened, no needs identified, will sign off No acute OT needs identified. Pt reports following with a hand therapy specialist with an appt this week. Pt reports able to manage ADLs/mobility at hospital, minor difficulties with self feeding but reports heat/cold help and pt able to complete meals without assist. Recommend continuation with OP therapy services.   Lorre Munroe 01/21/2021, 9:18 AM

## 2021-01-21 NOTE — Discharge Instructions (Addendum)
Follow with Primary MD Nino Glow, PA-C in 7 days and with preferred Neurologist in 7-10 days.  Follow the restrictions given by the orthopedic surgeon on your right wrist/arm weightbearing and activity after your recent surgery, continue same precautions unchanged.  Follow-up with the orthopedic surgeon within a week as well.  Get CBC, CMP, TSH, RPR, Magnesium -  checked next visit within 1 week by Primary MD    Activity: As tolerated with Full fall precautions use walker/cane & assistance as needed  Disposition Home   Diet: Heart Healthy    Special Instructions: If you have smoked or chewed Tobacco  in the last 2 yrs please stop smoking, stop any regular Alcohol  and or any Recreational drug use.  On your next visit with your primary care physician please Get Medicines reviewed and adjusted.  Please request your Prim.MD to go over all Hospital Tests and Procedure/Radiological results at the follow up, please get all Hospital records sent to your Prim MD by signing hospital release before you go home.  If you experience worsening of your admission symptoms, develop shortness of breath, life threatening emergency, suicidal or homicidal thoughts you must seek medical attention immediately by calling 911 or calling your MD immediately  if symptoms less severe.  You Must read complete instructions/literature along with all the possible adverse reactions/side effects for all the Medicines you take and that have been prescribed to you. Take any new Medicines after you have completely understood and accpet all the possible adverse reactions/side effects.

## 2021-01-21 NOTE — Discharge Summary (Signed)
Sarah Kelly ONG:[id number] DOB: 1960-11-04 DOA: 01/19/2021  PCP: Nino Glow, PA-C  Admit date: 01/19/2021  Discharge date: 01/21/2021  Admitted From: Home  Disposition:  Home   Recommendations for Outpatient Follow-up:   Follow up with PCP in 1-2 weeks  PCP Please obtain BMP/CBC, 2 view CXR in 1week,  (see Discharge instructions)   PCP Please follow up on the following pending results: RPR pending   Home Health: None   Equipment/Devices: None  Consultations: Neuro Discharge Condition: Stable    CODE STATUS: Full    Diet Recommendation: Heart Healthy     Chief Complaint  Patient presents with   Arm Pain     Brief history of present illness from the day of admission and additional interim summary    Sarah Kelly is a 60 y.o. female with medical history significant of HTN, migraine headaches, stroke. Pt presents to ED with c/o L arm pain.  Had tingling and numbness to Pinky of L arm.  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  She was admitted to the hospital for evaluation of left arm tingling numbness.                                                                 Hospital Course      L Arm and 5th digit numbness x 2 weeks  - this does not seem to be related to chest discomfort, she says she had right wrist surgery and she has been having some left arm discomfort and numbness ever since then, since she has previous history of left MCA coiling she cannot undergo MRI, he was seen by neurology Case discussed with neurologist, symptoms are not concerning, likely peripheral neuropathy, initial TSH and B12 stable, RPR  pending, outpatient neurology follow-up is recommended, request PCP to follow pending RPR and repeat TSH 1 more time.     2.  Hypertension upon admission.  Medications discontinued HCTZ as she was extremely dehydrated at the time of admission.   3.  History of migraines states she takes Imitrex at home we will give a dose of Imitrex due to mild headache.   4.  Hypokalemia due to dehydration.  Potassium replaced and has been adequately hydrated with IV fluids.   5. ? Chest pain in ER - EKG non acute, pain free, Trop -ve x 3.  Bleakley symptom-free.  Outpatient PCP follow-up for secondary risk factor modifications.  Could consider outpatient echocardiogram.   Discharge diagnosis     Principal Problem:   Chest pain Active Problems:   Hyponatremia   HTN (hypertension)  Discharge instructions    Discharge Instructions     Diet - low sodium heart healthy   Complete by: As directed    Discharge instructions   Complete by: As directed    Follow with Primary MD Nino GlowWalters, Christina Marie, PA-C in 7 days and with preferred Neurologist in 7-10 days.  Follow the restrictions given by the orthopedic surgeon on your right wrist/arm weightbearing and activity after your recent surgery, continue same precautions unchanged.  Follow-up with the orthopedic surgeon within a week as well.  Get CBC, CMP, TSH, RPR, Magnesium -  checked next visit within 1 week by Primary MD    Activity: As tolerated with Full fall precautions use walker/cane & assistance as needed  Disposition Home   Diet: Heart Healthy    Special Instructions: If you have smoked or chewed Tobacco  in the last 2 yrs please stop smoking, stop any regular Alcohol  and or any Recreational drug use.  On your next visit with your primary care physician please Get Medicines reviewed and adjusted.  Please request your Prim.MD to go over all Hospital Tests and Procedure/Radiological results at the follow up, please get all Hospital  records sent to your Prim MD by signing hospital release before you go home.  If you experience worsening of your admission symptoms, develop shortness of breath, life threatening emergency, suicidal or homicidal thoughts you must seek medical attention immediately by calling 911 or calling your MD immediately  if symptoms less severe.  You Must read complete instructions/literature along with all the possible adverse reactions/side effects for all the Medicines you take and that have been prescribed to you. Take any new Medicines after you have completely understood and accpet all the possible adverse reactions/side effects.   Increase activity slowly   Complete by: As directed        Discharge Medications   Allergies as of 01/21/2021       Reactions   Corticosteroids    Agitation and insomnia   Cymbalta [duloxetine Hcl]    Lexapro [escitalopram]    Wellbutrin [bupropion]    Zoloft [sertraline]    Zonisamide         Medication List     STOP taking these medications    hydrochlorothiazide 12.5 MG tablet Commonly known as: HYDRODIURIL   ibuprofen 800 MG tablet Commonly known as: ADVIL       TAKE these medications    amLODipine 5 MG tablet Commonly known as: NORVASC Take 5 mg by mouth daily.   ARTIFICIAL TEARS OP Apply 1 drop to eye daily as needed (Dry eyes).   atorvastatin 10 MG tablet Commonly known as: LIPITOR Take 10 mg by mouth daily.   BC HEADACHE POWDER PO Take 1 packet by mouth daily as needed (Headache).   lisinopril 40 MG tablet Commonly known as: ZESTRIL Take 40 mg by mouth daily.   meclizine 25 MG tablet Commonly known as: ANTIVERT Take 25 mg by mouth 3 (three) times daily as needed (car sickness/motion sickness).   multivitamin with minerals Tabs tablet Take 1 tablet by mouth daily.   NASAL SALINE NA Place 1 spray into the nose daily as needed (Nasal congestion, allergies).   Nucynta ER 200 MG Tb12 Generic drug: Tapentadol HCl Take  200 mg by mouth every 12 (twelve) hours.   ondansetron 4 MG disintegrating tablet Commonly known as: ZOFRAN-ODT Take 4 mg by mouth every 8 (eight) hours as needed for nausea/vomiting.   oxyCODONE-acetaminophen 5-325 MG tablet Commonly known  as: PERCOCET/ROXICET Take 1 tablet by mouth every 6 (six) hours as needed for pain.   primidone 250 MG tablet Commonly known as: MYSOLINE Take 250-500 mg by mouth See admin instructions. 250mg  in the morning 500mg  at night   sertraline 100 MG tablet Commonly known as: ZOLOFT Take 50 mg by mouth daily.   VITAMIN B-1 PO Take 1 tablet by mouth daily.   VITAMIN C PO Take 1 tablet by mouth daily.         Follow-up Information     , PA-C. Schedule an appointment as soon as possible for a visit in 1 week(s).   Specialty: Family Medicine Contact information: [address] [place] [address] [place] [phone]         [facility]. Schedule an appointment as soon as possible for a visit in 1 week(s).   Contact information: [address]     [address] [place] [address] [place] [phone]                Major procedures and Radiology Reports - PLEASE review detailed and final reports thoroughly  -        DG Chest 2 View  Result Date: 01/19/2021 CLINICAL DATA:  Left arm pain EXAM: CHEST - 2 VIEW COMPARISON:  12/20/2017 FINDINGS: Heart and mediastinal contours are within normal limits. No focal opacities or effusions. No acute bony abnormality. IMPRESSION: No active cardiopulmonary disease. Electronically Signed   By: 03/21/2021 M.D.   On: 01/19/2021 18:31   CT Head Wo Contrast  Result Date: 01/19/2021 CLINICAL DATA:  Headache EXAM: CT HEAD WITHOUT CONTRAST TECHNIQUE: Contiguous axial images were obtained from the base of the skull through the vertex without intravenous contrast. COMPARISON:  08/26/2018 FINDINGS: Brain: Old left frontal and frontoparietal infarcts,  unchanged. No acute intracranial abnormality. Specifically, no hemorrhage, hydrocephalus, mass lesion, acute infarction, or significant intracranial injury. Vascular: Prior left ICA aneurysm coiling again noted. No hyperdense vessels. Skull: No acute calvarial abnormality. 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 Prior left ICA aneurysm coiling. No acute intracranial abnormality. Electronically Signed   By: 03/21/2021 M.D.   On: 01/19/2021 18:31      Today   Subjective    Sarah Kelly today has no headache,no chest abdominal pain,no new weakness tingling or numbness, feels much better wants to go home today.     Objective   Blood pressure 128/70, pulse 73, temperature 98.1 F (36.7 C), temperature source Oral, resp. rate 16, height 5\' 1"  (1.549 m), weight 59.4 kg, SpO2 97 %.   Intake/Output Summary (Last 24 hours) at 01/21/2021 1124 Last data filed at 01/20/2021 1800 Gross per 24 hour  Intake 519.56 ml  Output --  Net 519.56 ml    Exam  Awake Alert, No new F.N deficits, mild L 5th digit numbness [place].AT,PERRAL Supple Neck,No JVD, No cervical lymphadenopathy appriciated.  Symmetrical Chest wall movement, Good air movement bilaterally, CTAB RRR,No Gallops,Rubs or new Murmurs, No Parasternal Heave +ve B.Sounds, Abd Soft, Non tender, No organomegaly appriciated, No rebound -guarding or rigidity. No Cyanosis, Clubbing or edema, No new Rash or bruise   Data Review   CBC w Diff:  Lab Results  Component Value Date   WBC 6.3 01/21/2021   HGB 12.7 01/21/2021   HCT 35.6 (L) 01/21/2021   PLT 348 01/21/2021   LYMPHOPCT 26 01/21/2021   MONOPCT 13 01/21/2021   EOSPCT 2 01/21/2021   BASOPCT 1  01/21/2021    CMP:  Lab Results  Component Value Date   NA 134 (L) 01/21/2021   K 3.5 01/21/2021   CL 101 01/21/2021   CO2 25 01/21/2021   BUN 8 01/21/2021   CREATININE 0.47 01/21/2021   PROT 5.8 (L) 01/21/2021   ALBUMIN 3.2 (L)  01/21/2021   BILITOT <0.1 (L) 01/21/2021   ALKPHOS 72 01/21/2021   AST 27 01/21/2021   ALT 24 01/21/2021  .   Total Time in preparing paper work, data evaluation and todays exam - 35 minutes  Susa Raring M.D on 01/21/2021 at 11:24 AM  [facility]

## 2021-02-02 ENCOUNTER — Encounter (HOSPITAL_COMMUNITY): Payer: Self-pay | Admitting: Emergency Medicine

## 2021-02-02 ENCOUNTER — Emergency Department (HOSPITAL_COMMUNITY)
Admission: EM | Admit: 2021-02-02 | Discharge: 2021-02-02 | Disposition: A | Discharge status: Home / Self Care | Payer: BC Managed Care – PPO | Attending: Emergency Medicine | Admitting: Emergency Medicine

## 2021-02-02 ENCOUNTER — Other Ambulatory Visit: Payer: Self-pay

## 2021-02-02 DIAGNOSIS — G43809 Other migraine, not intractable, without status migrainosus: Secondary | ICD-10-CM | POA: Insufficient documentation

## 2021-02-02 DIAGNOSIS — Z79899 Other long term (current) drug therapy: Secondary | ICD-10-CM | POA: Insufficient documentation

## 2021-02-02 DIAGNOSIS — Z87891 Personal history of nicotine dependence: Secondary | ICD-10-CM | POA: Insufficient documentation

## 2021-02-02 DIAGNOSIS — I10 Essential (primary) hypertension: Secondary | ICD-10-CM | POA: Insufficient documentation

## 2021-02-02 DIAGNOSIS — R519 Headache, unspecified: Secondary | ICD-10-CM | POA: Diagnosis present

## 2021-02-02 MED ORDER — DIPHENHYDRAMINE HCL 50 MG/ML IJ SOLN
25.0000 mg | Freq: Once | INTRAMUSCULAR | Status: AC
  Administered 2021-02-02: 25 mg via INTRAVENOUS
  Filled 2021-02-02: qty 1

## 2021-02-02 MED ORDER — PROCHLORPERAZINE EDISYLATE 10 MG/2ML IJ SOLN
10.0000 mg | Freq: Once | INTRAMUSCULAR | Status: AC
  Administered 2021-02-02: 10 mg via INTRAVENOUS
  Filled 2021-02-02: qty 2

## 2021-02-02 MED ORDER — LACTATED RINGERS IV BOLUS
500.0000 mL | Freq: Once | INTRAVENOUS | Status: AC
  Administered 2021-02-02: 500 mL via INTRAVENOUS

## 2021-02-02 NOTE — ED Triage Notes (Signed)
Pt BIB [facility] c/o headache with nausea and photosensitivity sine 0300 this AM. Pt states she was seen here recently for something similar and her sodium was low. Pt states she has not been sleeping well. Hx migraine, htn.  EMS VS- 160/90, HR 82, RR 16, SpO2 99% RA

## 2021-02-02 NOTE — ED Provider Notes (Signed)
[facility] EMERGENCY DEPARTMENT Provider Note   CSN: [id number] Arrival date & time: 02/02/21  2979     History Chief Complaint  Patient presents with   Headache    Sarah Kelly is a 60 y.o. female.  HPI Patient reports that she has a headache which she is believes is a migraine.  Patient reports has had a longstanding history of migraine headaches.  She reports that she has had a lot of difficulty sleeping since her recent hospitalization and wrist fracture.  She reports due to difficulty sleeping she thinks is triggered her migraines.  She reports the pain is often sharp and aching quality sometimes to the left side of her head and then other times to the vertex.  She reports that the quality and severity are consistent.  She denies that she had a sudden headache that was different from previous headaches.  She reports she does have some light sensitivity which is typical for her headaches.  Occasionally nauseous she is not having any vomiting.  She does not feel she has any new weakness numbness tingling or confusion.    Past Medical History:  Diagnosis Date   Anxiety    Back pain    Concussion    Fracture of lumbar vertebra (HCC) L 5 fracture   Hypertension    Migraine    Stroke [facility])     Patient Active Problem List   Diagnosis Date Noted   Hyponatremia 01/20/2021   HTN (hypertension) 01/20/2021   Chest pain 01/19/2021    Past Surgical History:  Procedure Laterality Date   BREAST SURGERY     TONSILECTOMY/ADENOIDECTOMY WITH MYRINGOTOMY       OB History   No obstetric history on file.     Family History  Problem Relation Age of Onset   Cancer Father    Heart disease Paternal Grandfather     Social History   Tobacco Use   Smoking status: Former  Substance Use Topics   Alcohol use: Yes   Drug use: No    Home Medications Prior to Admission medications   Medication Sig Start Date End Date Taking? Authorizing Provider  amLODipine  (NORVASC) 5 MG tablet Take 5 mg by mouth daily. 01/15/21   [provider]  Ascorbic Acid (VITAMIN C PO) Take 1 tablet by mouth daily.    [provider]  Aspirin-Salicylamide-Caffeine (BC HEADACHE POWDER PO) Take 1 packet by mouth daily as needed (Headache).    [provider]  atorvastatin (LIPITOR) 10 MG tablet Take 10 mg by mouth daily. 01/06/21   [provider]  Hypromellose (ARTIFICIAL TEARS OP) Apply 1 drop to eye daily as needed (Dry eyes).    [provider]  lisinopril (ZESTRIL) 40 MG tablet Take 40 mg by mouth daily. 12/20/20   [provider]  meclizine (ANTIVERT) 25 MG tablet Take 25 mg by mouth 3 (three) times daily as needed (car sickness/motion sickness). 12/25/20   [provider]  Multiple Vitamin (MULTIVITAMIN WITH MINERALS) TABS tablet Take 1 tablet by mouth daily.    [provider]  NASAL SALINE NA Place 1 spray into the nose daily as needed (Nasal congestion, allergies).    [provider]  NUCYNTA ER 200 MG TB12 Take 200 mg by mouth every 12 (twelve) hours. 11/29/20   [provider]  ondansetron (ZOFRAN-ODT) 4 MG disintegrating tablet Take 4 mg by mouth every 8 (eight) hours as needed for nausea/vomiting. 12/24/20   [provider]  oxyCODONE-acetaminophen (PERCOCET/ROXICET) 5-325 MG tablet Take 1 tablet by mouth every 6 (six) hours as needed for pain. 01/03/21   [provider]  primidone (MYSOLINE) 250 MG tablet Take 250-500 mg by mouth See admin instructions. 250mg  in the morning 500mg  at night 12/29/20   [provider]  sertraline (ZOLOFT) 100 MG tablet Take 50 mg by mouth daily. 01/02/21   [provider]  Thiamine HCl (VITAMIN B-1 PO) Take 1 tablet by mouth daily.    [provider]    Allergies    Corticosteroids, Cymbalta [duloxetine hcl], Lexapro [escitalopram], Wellbutrin [bupropion], Zoloft [sertraline], and Zonisamide  Review of  Systems   Review of Systems 10 systems reviewed negative except per HPI Physical Exam Updated Vital Signs BP (!) 148/85   Pulse 89   Temp 98.6 F (37 C) (Oral)   Resp 15   Ht 5\' 1"  (1.549 m)   Wt 60.8 kg   SpO2 96%   BMI 25.32 kg/m   Physical Exam Constitutional:      Appearance: Normal appearance.  HENT:     Head: Normocephalic and atraumatic.     Mouth/Throat:     Pharynx: Oropharynx is clear.  Eyes:     Extraocular Movements: Extraocular movements intact.     Conjunctiva/sclera: Conjunctivae normal.  Cardiovascular:     Rate and Rhythm: Normal rate and regular rhythm.  Pulmonary:     Effort: Pulmonary effort is normal.     Breath sounds: Normal breath sounds.  Abdominal:     General: There is no distension.     Palpations: Abdomen is soft.     Tenderness: There is no abdominal tenderness. There is no guarding.  Musculoskeletal:        General: Normal range of motion.     Cervical back: Neck supple.     Right lower leg: No edema.     Left lower leg: No edema.     Comments: Patient is wearing a removable wrist splint on the right wrist.  Skin:    General: Skin is warm and dry.  Neurological:     General: No focal deficit present.     Mental Status: She is alert and oriented to person, place, and time.     Cranial Nerves: No cranial nerve deficit.     Motor: No weakness.     Coordination: Coordination normal.  Psychiatric:        Mood and Affect: Mood normal.    ED Results / Procedures / Treatments   Labs (all labs ordered are listed, but only abnormal results are displayed) Labs Reviewed - No data to display  EKG None  Radiology No results found.  Procedures Procedures   Medications Ordered in ED Medications  prochlorperazine (COMPAZINE) injection 10 mg (10 mg Intravenous Given 02/02/21 1025)  diphenhydrAMINE (BENADRYL) injection 25 mg (25 mg Intravenous Given 02/02/21 1023)  lactated ringers bolus 500 mL (500 mLs Intravenous New Bag/Given 02/02/21  1029)    ED Course  I have reviewed the triage vital signs and the nursing notes.  Pertinent labs & imaging results that were available during my care of the patient were reviewed by me and considered in my medical decision making (see chart for details).    MDM Rules/Calculators/A&P                           Patient reports her headache is typical for a migraine.  She attributes it to lack  of sleep and additional stress over the past days to weeks with some other medical problems.  Patient denies that the headache was sudden or there are new symptoms.  Patient does have history of prior aneurysm coiling but does not think this pain is similar.  She has not had much success taking her home migraine therapies.  Patient treated with Compazine and Benadryl and hydration.  She reports significant improvement in headache.  She has been up and ambulatory to the bathroom and coordinated gait.  At this time she feels ready to go home.  Return precautions reviewed.    Final Clinical Impression(s) / ED Diagnoses Final diagnoses:  Other migraine without status migrainosus, not intractable    Rx / DC Orders ED Discharge Orders     None        Arby Barrette, MD 02/02/21 1210

## 2021-02-02 NOTE — Discharge Instructions (Addendum)
1.  You are treated for a migraine headache.  Continue your usual medications. 2.   See your neurologist for recheck as soon as possible.  You may benefit from adjustments to your migraine medications. 3.  Return to the emergency department if you have any symptoms that are different from your typical migraine, your pain does not resolve or other concerning symptoms

## 2021-02-02 NOTE — ED Notes (Signed)
Pt verbalizes understanding of discharge instructions. Opportunity for questions and answers were provided. Pt discharged from the ED.   ?

## 2021-02-14 ENCOUNTER — Emergency Department (HOSPITAL_COMMUNITY): Payer: BC Managed Care – PPO

## 2021-02-14 ENCOUNTER — Emergency Department (HOSPITAL_COMMUNITY)
Admission: EM | Admit: 2021-02-14 | Discharge: 2021-02-14 | Disposition: A | Discharge status: Home / Self Care | Payer: BC Managed Care – PPO | Attending: Emergency Medicine | Admitting: Emergency Medicine

## 2021-02-14 ENCOUNTER — Other Ambulatory Visit: Payer: Self-pay

## 2021-02-14 ENCOUNTER — Encounter (HOSPITAL_COMMUNITY): Payer: Self-pay | Admitting: *Deleted

## 2021-02-14 DIAGNOSIS — I10 Essential (primary) hypertension: Secondary | ICD-10-CM | POA: Insufficient documentation

## 2021-02-14 DIAGNOSIS — Z87891 Personal history of nicotine dependence: Secondary | ICD-10-CM | POA: Insufficient documentation

## 2021-02-14 DIAGNOSIS — Z20822 Contact with and (suspected) exposure to covid-19: Secondary | ICD-10-CM | POA: Insufficient documentation

## 2021-02-14 DIAGNOSIS — R531 Weakness: Secondary | ICD-10-CM | POA: Diagnosis not present

## 2021-02-14 DIAGNOSIS — Z79899 Other long term (current) drug therapy: Secondary | ICD-10-CM | POA: Diagnosis not present

## 2021-02-14 DIAGNOSIS — G43811 Other migraine, intractable, with status migrainosus: Secondary | ICD-10-CM | POA: Diagnosis not present

## 2021-02-14 DIAGNOSIS — M791 Myalgia, unspecified site: Secondary | ICD-10-CM | POA: Diagnosis present

## 2021-02-14 DIAGNOSIS — R509 Fever, unspecified: Secondary | ICD-10-CM | POA: Insufficient documentation

## 2021-02-14 LAB — RESP PANEL BY RT-PCR (FLU A&B, COVID) ARPGX2
Influenza A by PCR: NEGATIVE
Influenza B by PCR: NEGATIVE
SARS Coronavirus 2 by RT PCR: NEGATIVE

## 2021-02-14 LAB — COMPREHENSIVE METABOLIC PANEL
ALT: 58 U/L — ABNORMAL HIGH (ref 0–44)
AST: 53 U/L — ABNORMAL HIGH (ref 15–41)
Albumin: 3.9 g/dL (ref 3.5–5.0)
Alkaline Phosphatase: 103 U/L (ref 38–126)
Anion gap: 12 (ref 5–15)
BUN: 11 mg/dL (ref 8–23)
CO2: 22 mmol/L (ref 22–32)
Calcium: 9.1 mg/dL (ref 8.9–10.3)
Chloride: 102 mmol/L (ref 98–111)
Creatinine, Ser: 0.59 mg/dL (ref 0.44–1.00)
GFR, Estimated: >60 mL/min (ref >60)
Glucose, Bld: 102 mg/dL — ABNORMAL HIGH (ref 70–99)
Potassium: 3.7 mmol/L (ref 3.5–5.1)
Sodium: 136 mmol/L (ref 135–145)
Total Bilirubin: 0.6 mg/dL (ref 0.3–1.2)
Total Protein: 6.7 g/dL (ref 6.5–8.1)

## 2021-02-14 LAB — URINALYSIS, ROUTINE W REFLEX MICROSCOPIC
Bilirubin Urine: NEGATIVE
Glucose, UA: NEGATIVE mg/dL
Hgb urine dipstick: NEGATIVE
Ketones, ur: NEGATIVE mg/dL
Leukocytes,Ua: NEGATIVE
Nitrite: NEGATIVE
Protein, ur: NEGATIVE mg/dL
Specific Gravity, Urine: 1.009 (ref 1.005–1.030)
pH: 6 (ref 5.0–8.0)

## 2021-02-14 LAB — CBC WITH DIFFERENTIAL/PLATELET
Abs Immature Granulocytes: 0.02 10*3/uL (ref 0.00–0.07)
Basophils Absolute: 0 10*3/uL (ref 0.0–0.1)
Basophils Relative: 0 %
Eosinophils Absolute: 0 10*3/uL (ref 0.0–0.5)
Eosinophils Relative: 0 %
HCT: 41.9 % (ref 36.0–46.0)
Hemoglobin: 14.7 g/dL (ref 12.0–15.0)
Immature Granulocytes: 0 %
Lymphocytes Relative: 15 %
Lymphs Abs: 1.2 10*3/uL (ref 0.7–4.0)
MCH: 35.9 pg — ABNORMAL HIGH (ref 26.0–34.0)
MCHC: 35.1 g/dL (ref 30.0–36.0)
MCV: 102.4 fL — ABNORMAL HIGH (ref 80.0–100.0)
Monocytes Absolute: 0.8 10*3/uL (ref 0.1–1.0)
Monocytes Relative: 10 %
Neutro Abs: 5.8 10*3/uL (ref 1.7–7.7)
Neutrophils Relative %: 75 %
Platelets: 309 10*3/uL (ref 150–400)
RBC: 4.09 MIL/uL (ref 3.87–5.11)
RDW: 12.4 % (ref 11.5–15.5)
WBC: 7.8 10*3/uL (ref 4.0–10.5)
nRBC: 0 % (ref 0.0–0.2)

## 2021-02-14 LAB — LIPASE, BLOOD: Lipase: 40 U/L (ref 11–51)

## 2021-02-14 MED ORDER — SODIUM CHLORIDE 0.9 % IV SOLN: INTRAVENOUS | Status: DC

## 2021-02-14 MED ORDER — SODIUM CHLORIDE 0.9 % IV BOLUS
1000.0000 mL | Freq: Once | INTRAVENOUS | Status: AC
  Administered 2021-02-14: 1000 mL via INTRAVENOUS

## 2021-02-14 MED ORDER — PROCHLORPERAZINE EDISYLATE 10 MG/2ML IJ SOLN
10.0000 mg | Freq: Once | INTRAMUSCULAR | Status: AC
  Administered 2021-02-14: 10 mg via INTRAVENOUS
  Filled 2021-02-14: qty 2

## 2021-02-14 MED ORDER — DIPHENHYDRAMINE HCL 50 MG/ML IJ SOLN
25.0000 mg | Freq: Once | INTRAMUSCULAR | Status: AC
  Administered 2021-02-14: 25 mg via INTRAVENOUS
  Filled 2021-02-14: qty 1

## 2021-02-14 NOTE — ED Provider Notes (Signed)
[facility] EMERGENCY DEPARTMENT Provider Note   CSN: [id number] Arrival date & time: 02/14/21  2595     History Chief Complaint  Patient presents with   Generalized Body Aches    Sarah Kelly is a 60 y.o. female.  Patient brought in by EMS.  Patient with a complaint of generalized body weakness and body aches for 1 week.  States been running fever on and off at home.  Patient states recently hospitalized for hyponatremia.  Patient with complaint of headache states that it is typical of her migraines.  Patient states she may be stressed out.  Chart review shows that patient had CT of her head during her admission on August 7 that was negative.  Patient recently seen at the end of August for migraine given migraine cocktail with improvement.  Patient has allergies to steroids So cannot receive Decadron or prednisone.      Past Medical History:  Diagnosis Date   Anxiety    Back pain    Concussion    Fracture of lumbar vertebra (HCC) L 5 fracture   Hypertension    Migraine    Stroke [facility])     Patient Active Problem List   Diagnosis Date Noted   Hyponatremia 01/20/2021   HTN (hypertension) 01/20/2021   Chest pain 01/19/2021    Past Surgical History:  Procedure Laterality Date   BREAST SURGERY     TONSILECTOMY/ADENOIDECTOMY WITH MYRINGOTOMY       OB History   No obstetric history on file.     Family History  Problem Relation Age of Onset   Cancer Father    Heart disease Paternal Grandfather     Social History   Tobacco Use   Smoking status: Former   Smokeless tobacco: Never  Substance Use Topics   Alcohol use: Yes   Drug use: No    Home Medications Prior to Admission medications   Medication Sig Start Date End Date Taking? Authorizing Provider  amLODipine (NORVASC) 5 MG tablet Take 5 mg by mouth daily. 01/15/21   [provider]  Ascorbic Acid (VITAMIN C PO) Take 1 tablet by mouth daily.    [provider]   Aspirin-Salicylamide-Caffeine (BC HEADACHE POWDER PO) Take 1 packet by mouth daily as needed (Headache).    [provider]  atorvastatin (LIPITOR) 10 MG tablet Take 10 mg by mouth daily. 01/06/21   [provider]  Hypromellose (ARTIFICIAL TEARS OP) Apply 1 drop to eye daily as needed (Dry eyes).    [provider]  lisinopril (ZESTRIL) 40 MG tablet Take 40 mg by mouth daily. 12/20/20   [provider]  meclizine (ANTIVERT) 25 MG tablet Take 25 mg by mouth 3 (three) times daily as needed (car sickness/motion sickness). 12/25/20   [provider]  Multiple Vitamin (MULTIVITAMIN WITH MINERALS) TABS tablet Take 1 tablet by mouth daily.    [provider]  NASAL SALINE NA Place 1 spray into the nose daily as needed (Nasal congestion, allergies).    [provider]  NUCYNTA ER 200 MG TB12 Take 200 mg by mouth every 12 (twelve) hours. 11/29/20   [provider]  ondansetron (ZOFRAN-ODT) 4 MG disintegrating tablet Take 4 mg by mouth every 8 (eight) hours as needed for nausea/vomiting. 12/24/20   [provider]  oxyCODONE-acetaminophen (PERCOCET/ROXICET) 5-325 MG tablet Take 1 tablet by mouth every 6 (six) hours as needed for pain. 01/03/21   [provider]  primidone (MYSOLINE) 250 MG tablet  Take 250-500 mg by mouth See admin instructions. 250mg  in the morning 500mg  at night 12/29/20   [provider]  sertraline (ZOLOFT) 100 MG tablet Take 50 mg by mouth daily. 01/02/21   [provider]  Thiamine HCl (VITAMIN B-1 PO) Take 1 tablet by mouth daily.    [provider]    Allergies    Corticosteroids, Cymbalta [duloxetine hcl], Lexapro [escitalopram], Wellbutrin [bupropion], Zoloft [sertraline], and Zonisamide  Review of Systems   Review of Systems  Constitutional:  Negative for chills and fever.  HENT:  Negative for ear pain and sore throat.   Eyes:  Negative for pain and visual  disturbance.  Respiratory:  Negative for cough and shortness of breath.   Cardiovascular:  Negative for chest pain and palpitations.  Gastrointestinal:  Negative for abdominal pain and vomiting.  Genitourinary:  Negative for dysuria and hematuria.  Musculoskeletal:  Positive for myalgias. Negative for arthralgias and back pain.  Skin:  Negative for color change and rash.  Neurological:  Positive for headaches. Negative for seizures and syncope.  All other systems reviewed and are negative.  Physical Exam Updated Vital Signs BP (!) 149/90   Pulse 94   Temp 98.6 F (37 C) (Oral)   Resp 18   Ht 1.575 m (5\' 2" )   Wt 61.2 kg   SpO2 96%   BMI 24.69 kg/m   Physical Exam Vitals and nursing note reviewed.  Constitutional:      General: She is not in acute distress.    Appearance: Normal appearance. She is well-developed.  HENT:     Head: Normocephalic and atraumatic.  Eyes:     Extraocular Movements: Extraocular movements intact.     Conjunctiva/sclera: Conjunctivae normal.     Pupils: Pupils are equal, round, and reactive to light.  Cardiovascular:     Rate and Rhythm: Normal rate and regular rhythm.     Heart sounds: No murmur heard. Pulmonary:     Effort: Pulmonary effort is normal. No respiratory distress.     Breath sounds: Normal breath sounds.  Abdominal:     Palpations: Abdomen is soft.     Tenderness: There is no abdominal tenderness.  Musculoskeletal:        General: No swelling. Normal range of motion.     Cervical back: Normal range of motion and neck supple. No rigidity.  Skin:    General: Skin is warm and dry.  Neurological:     General: No focal deficit present.     Mental Status: She is alert and oriented to person, place, and time.     Cranial Nerves: No cranial nerve deficit.     Sensory: No sensory deficit.     Motor: No weakness.    ED Results / Procedures / Treatments   Labs (all labs ordered are listed, but only abnormal results are  displayed) Labs Reviewed  COMPREHENSIVE METABOLIC PANEL - Abnormal; Notable for the following components:      Result Value   Glucose, Bld 102 (*)    AST 53 (*)    ALT 58 (*)    All other components within normal limits  CBC WITH DIFFERENTIAL/PLATELET - Abnormal; Notable for the following components:   MCV 102.4 (*)    MCH 35.9 (*)    All other components within normal limits  URINALYSIS, ROUTINE W REFLEX MICROSCOPIC - Abnormal; Notable for the following components:   Color, Urine STRAW (*)    All other components within normal limits  RESP PANEL BY RT-PCR (FLU A&B, COVID) ARPGX2  LIPASE, BLOOD    EKG None  Radiology DG Chest Port 1 View  Result Date: 02/14/2021 CLINICAL DATA:  Body aches. EXAM: PORTABLE CHEST 1 VIEW COMPARISON:  01/19/2021 FINDINGS: The lungs are clear without focal pneumonia, edema, pneumothorax or pleural effusion. The cardiopericardial silhouette is within normal limits for size. The visualized bony structures of the thorax show no acute abnormality. Telemetry leads overlie the chest. IMPRESSION: No active disease. Electronically Signed   By: Kennith Center M.D.   On: 02/14/2021 10:10    Procedures Procedures   Medications Ordered in ED Medications  0.9 %  sodium chloride infusion ( Intravenous New Bag/Given 02/14/21 1105)  sodium chloride 0.9 % bolus 1,000 mL (0 mLs Intravenous Stopped 02/14/21 1103)  diphenhydrAMINE (BENADRYL) injection 25 mg (25 mg Intravenous Given 02/14/21 1031)  prochlorperazine (COMPAZINE) injection 10 mg (10 mg Intravenous Given 02/14/21 1031)    ED Course  I have reviewed the triage vital signs and the nursing notes.  Pertinent labs & imaging results that were available during my care of the patient were reviewed by me and considered in my medical decision making (see chart for details).    MDM Rules/Calculators/A&P                           Patient's headache improved with migraine cocktail and IV fluids.  But not completely  resolved.  Rest of her work-up without any significant findings.  COVID influenza testing negative.  Urinalysis negative for urinary tract infection.  Complete metabolic panel without any significant abnormalities most important patient's sodium was 136 which is still a little on the low side but is in the normal range.  Markedly improved from her sodiums in the 120s previously.  CBC no leukocytosis no anemia.  Chest x-ray no active disease.  Patient stable for discharge home.  And follow-up with her primary care doctor    Final Clinical Impression(s) / ED Diagnoses Final diagnoses:  Myalgia  Other migraine with status migrainosus, intractable    Rx / DC Orders ED Discharge Orders     None        Vanetta Mulders, MD 02/14/21 1432

## 2021-02-14 NOTE — Discharge Instructions (Addendum)
Work-up for the body aches and generalized weakness without any acute findings.  Would recommend rest in a dark room for the migraine headache and hopefully will completely resolve.  Electrolytes here today were normal.  Make an appointment to follow-up with your doctor.  Return for any new or worse symptoms.

## 2021-02-14 NOTE — ED Triage Notes (Signed)
Patient presents to ed via [facility] from home c/o generalized body weakness x 1 week worse today states she had been running a fever off and on at home, states she was recently hospital for dehydration several weeks ago. Also c/o headache states she has migraines. 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

## 2021-05-07 ENCOUNTER — Ambulatory Visit (INDEPENDENT_AMBULATORY_CARE_PROVIDER_SITE_OTHER): Payer: BC Managed Care – PPO

## 2021-05-07 ENCOUNTER — Ambulatory Visit (HOSPITAL_COMMUNITY)
Admission: EM | Admit: 2021-05-07 | Discharge: 2021-05-07 | Disposition: A | Discharge status: Home / Self Care | Payer: BC Managed Care – PPO | Attending: Physician Assistant | Admitting: Physician Assistant

## 2021-05-07 ENCOUNTER — Encounter (HOSPITAL_COMMUNITY): Payer: Self-pay

## 2021-05-07 ENCOUNTER — Other Ambulatory Visit: Payer: Self-pay

## 2021-05-07 DIAGNOSIS — M79672 Pain in left foot: Secondary | ICD-10-CM

## 2021-05-07 DIAGNOSIS — S92352A Displaced fracture of fifth metatarsal bone, left foot, initial encounter for closed fracture: Secondary | ICD-10-CM | POA: Diagnosis not present

## 2021-05-07 DIAGNOSIS — S99922A Unspecified injury of left foot, initial encounter: Secondary | ICD-10-CM

## 2021-05-07 NOTE — Discharge Instructions (Signed)
You have a fracture of your fifth metatarsal.  Please stay in the cam boot and do not bear weight until seen by specialist.  Follow-up with orthopedics/podiatry as we discussed.  If you have any severe pain or worsening symptoms you need to be reevaluated.

## 2021-05-07 NOTE — ED Provider Notes (Signed)
[facility]    CSN: [id number] Arrival date & time: 05/07/21  1023      History   Chief Complaint Chief Complaint  Patient presents with   Foot Pain    Foot pain x 3 days    HPI Sarah Kelly is a 60 y.o. female.   Patient resents today with a 2 to 3-day history of left foot pain following injury.  Reports that she was going to put on her slipper when she took a misstep forcefully inverting foot and causing pain.  She has had ongoing pain at her left second and third toe with radiation into the dorsal foot since that time.  Pain is rated 8 on a 0-10 pain scale, described as aching, worse with attempted ambulation, no alleviating factors identified.  She is ambulating with a cane but reports this has been an ongoing issue since she had a stroke does not related to current injury.  Denies any numbness or paresthesias in foot.  Denies any previous surgery or injury involving her foot.  She is not diabetic.  Denies history of osteoporosis.   Past Medical History:  Diagnosis Date   Anxiety    Back pain    Concussion    Fracture of lumbar vertebra (HCC) L 5 fracture   Hypertension    Migraine    Stroke [facility])     Patient Active Problem List   Diagnosis Date Noted   Hyponatremia 01/20/2021   HTN (hypertension) 01/20/2021   Chest pain 01/19/2021    Past Surgical History:  Procedure Laterality Date   BREAST SURGERY     TONSILECTOMY/ADENOIDECTOMY WITH MYRINGOTOMY      OB History   No obstetric history on file.      Home Medications    Prior to Admission medications   Medication Sig Start Date End Date Taking? Authorizing Provider  amLODipine (NORVASC) 5 MG tablet Take 5 mg by mouth daily. 01/15/21   [provider]  Ascorbic Acid (VITAMIN C PO) Take 1 tablet by mouth daily.    [provider]  Aspirin-Salicylamide-Caffeine (BC HEADACHE POWDER PO) Take 1 packet by mouth daily as needed (Headache).    [provider]  atorvastatin  (LIPITOR) 10 MG tablet Take 10 mg by mouth daily. 01/06/21   [provider]  Hypromellose (ARTIFICIAL TEARS OP) Apply 1 drop to eye daily as needed (Dry eyes).    [provider]  lisinopril (ZESTRIL) 40 MG tablet Take 40 mg by mouth daily. 12/20/20   [provider]  meclizine (ANTIVERT) 25 MG tablet Take 25 mg by mouth 3 (three) times daily as needed (car sickness/motion sickness). 12/25/20   [provider]  Multiple Vitamin (MULTIVITAMIN WITH MINERALS) TABS tablet Take 1 tablet by mouth daily.    [provider]  NASAL SALINE NA Place 1 spray into the nose daily as needed (Nasal congestion, allergies).    [provider]  NUCYNTA ER 200 MG TB12 Take 200 mg by mouth every 12 (twelve) hours. 11/29/20   [provider]  ondansetron (ZOFRAN-ODT) 4 MG disintegrating tablet Take 4 mg by mouth every 8 (eight) hours as needed for nausea/vomiting. 12/24/20   [provider]  oxyCODONE-acetaminophen (PERCOCET/ROXICET) 5-325 MG tablet Take 1 tablet by mouth every 6 (six) hours as needed for pain. 01/03/21   [provider]  primidone (MYSOLINE) 250 MG tablet Take 250-500 mg by mouth See admin instructions. 250mg  in the morning 500mg  at night 12/29/20   [provider]  sertraline (ZOLOFT) 100 MG tablet Take 50 mg by mouth daily. 01/02/21   [provider]  Thiamine HCl (VITAMIN B-1 PO) Take 1 tablet by mouth daily.    [provider]    Family History Family History  Problem Relation Age of Onset   Cancer Father    Heart disease Paternal Grandfather     Social History Social History   Tobacco Use   Smoking status: Former   Smokeless tobacco: Never  Substance Use Topics   Alcohol use: Yes   Drug use: No     Allergies   Corticosteroids, Cymbalta [duloxetine hcl], Lexapro [escitalopram], Wellbutrin [bupropion], Zoloft [sertraline], and Zonisamide   Review of Systems Review of Systems   Constitutional:  Positive for activity change. Negative for appetite change, fatigue and fever.  Respiratory:  Negative for cough and shortness of breath.   Cardiovascular:  Negative for chest pain.  Gastrointestinal:  Negative for abdominal pain, diarrhea, nausea and vomiting.  Musculoskeletal:  Positive for arthralgias, gait problem and joint swelling. Negative for myalgias.  Skin:  Positive for color change.  Neurological:  Negative for dizziness, weakness, light-headedness, numbness and headaches.    Physical Exam Triage Vital Signs ED Triage Vitals  Enc Vitals Group     BP 05/07/21 1252 115/78     Pulse Rate 05/07/21 1252 83     Resp 05/07/21 1252 16     Temp 05/07/21 1252 98.2 F (36.8 C)     Temp Source 05/07/21 1252 Oral     SpO2 --      Weight --      Height --      Head Circumference --      Peak Flow --      Pain Score 05/07/21 1253 8     Pain Loc --      Pain Edu? --      Excl. in GC? --    No data found.  Updated Vital Signs BP 115/78 (BP Location: Left Arm)   Pulse 83   Temp 98.2 F (36.8 C) (Oral)   Resp 16   Visual Acuity Right Eye Distance:   Left Eye Distance:   Bilateral Distance:    Right Eye Near:   Left Eye Near:    Bilateral Near:     Physical Exam Vitals reviewed.  Constitutional:      General: She is awake. She is not in acute distress.    Appearance: Normal appearance. She is well-developed. She is not ill-appearing.     Comments: Very pleasant female appears stated age no acute distress sitting comfortably in exam room  HENT:     Head: Normocephalic and atraumatic.  Cardiovascular:     Rate and Rhythm: Normal rate and regular rhythm.     Pulses:          Posterior tibial pulses are 2+ on the right side and 2+ on the left side.     Heart sounds: Normal heart sounds, S1 normal and S2 normal. No murmur heard. Pulmonary:     Effort: Pulmonary effort is normal.     Breath sounds: Normal breath sounds. No wheezing, rhonchi or rales.      Comments: Clear to auscultation bilaterally Musculoskeletal:     Left foot: Decreased range of motion. Normal capillary refill. Swelling, tenderness and bony tenderness present. No deformity or prominent metatarsal heads.     Comments: Left foot: Ecchymosis noted over second and third left toe.  Normal active  range of motion of phalanges and ankle.  Tenderness palpation over metatarsals.  No deformity noted.  Foot neurovascularly intact.  Psychiatric:        Behavior: Behavior is cooperative.     UC Treatments / Results  Labs (all labs ordered are listed, but only abnormal results are displayed) Labs Reviewed - No data to display  EKG   Radiology DG Foot Complete Left  Result Date: 05/07/2021 CLINICAL DATA:  Pain post fall EXAM: LEFT FOOT - COMPLETE 3+ VIEW COMPARISON:  None. FINDINGS: Mildly angulated fracture deformity of the distal fifth metatarsal shaft. There is overlying soft tissue swelling. Mild diffuse osteopenia. Mild hallux valgus deformity. No dislocation. Small calcaneal spur at the insertion of the plantar aponeurosis. No other significant osseous degenerative change. IMPRESSION: 1. Mildly angulated left fifth metatarsal distal shaft fracture deformity. Electronically Signed   By: Corlis Leak M.D.   On: 05/07/2021 13:59    Procedures Procedures (including critical care time)  Medications Ordered in UC Medications - No data to display  Initial Impression / Assessment and Plan / UC Course  I have reviewed the triage vital signs and the nursing notes.  Pertinent labs & imaging results that were available during my care of the patient were reviewed by me and considered in my medical decision making (see chart for details).     X-ray obtained given mechanism of injury showed mildly displaced fifth metatarsal shaft fracture.  Patient was placed in cam boot and given crutches with instruction to be nonweightbearing until evaluated by podiatry.  Patient reported that she  is not sure she will be able to see specialist due to concern for finances but will continue nonweightbearing for the next several weeks.  She was encouraged use over-the-counter medications as needed for pain relief.  Discussed alarm symptoms that warrant emergent evaluation.  Recommended follow-up next week with specialist for repeat x-ray to ensure adequate healing.  Discussed alarm symptoms that warrant emergent evaluation.  Strict return precautions given to which she expressed understanding.  Final Clinical Impressions(s) / UC Diagnoses   Final diagnoses:  Left foot pain  Foot injury, left, initial encounter  Closed displaced fracture of fifth metatarsal bone of left foot, initial encounter     Discharge Instructions      You have a fracture of your fifth metatarsal.  Please stay in the cam boot and do not bear weight until seen by specialist.  Follow-up with orthopedics/podiatry as we discussed.  If you have any severe pain or worsening symptoms you need to be reevaluated.     ED Prescriptions   None    I have reviewed the PDMP during this encounter.   Jeani Hawking, PA-C 05/07/21 1458

## 2021-05-07 NOTE — ED Triage Notes (Signed)
Patient presents for foot pain today. This started 2-3 days ago.

## 2021-12-01 IMAGING — CR DG CHEST 2V
2 series · 2 of 2 positions shown · non-contrast
Comparison: 12/20/2017

CLINICAL DATA: Left arm pain

EXAM:
CHEST - 2 VIEW

[w chest pa]
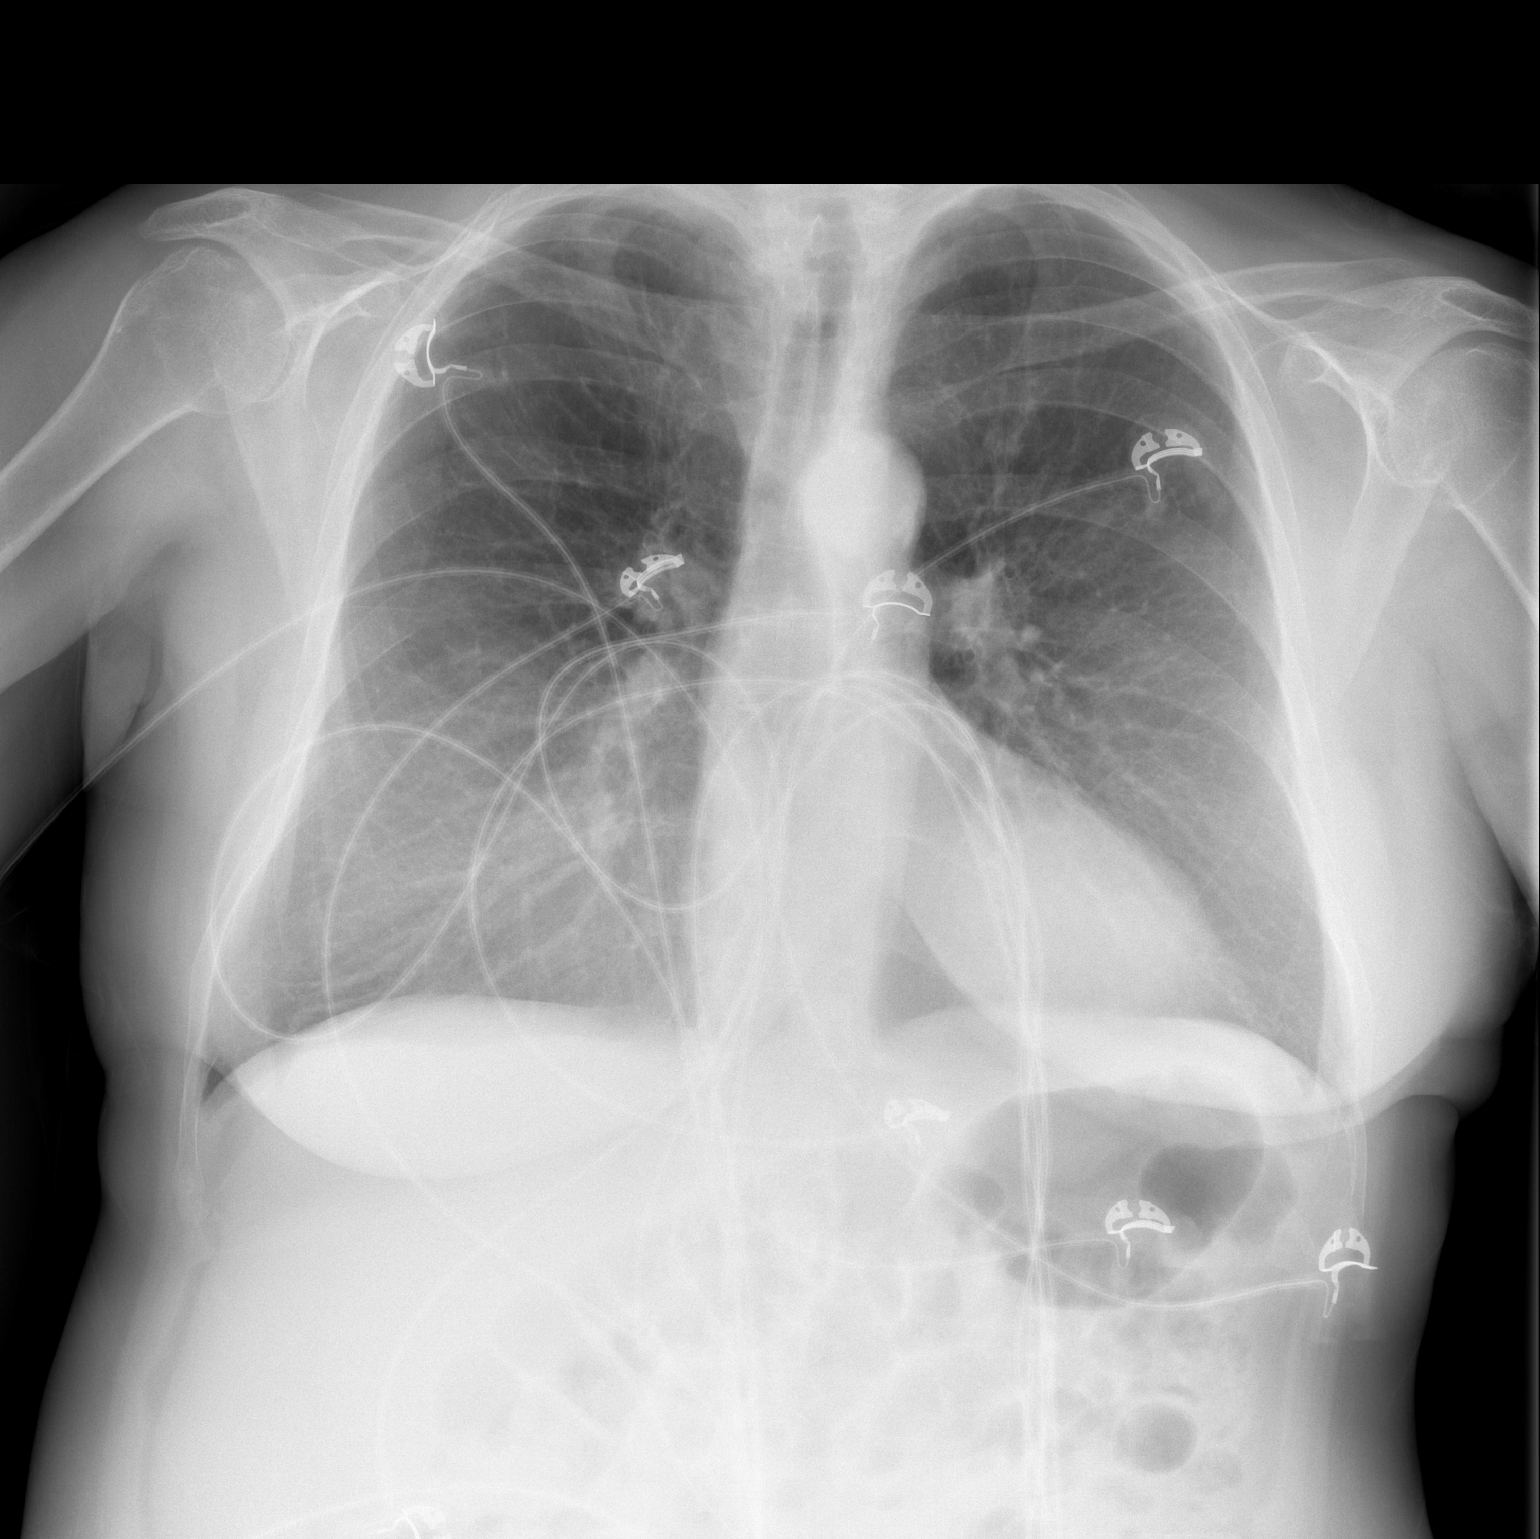

[w chest lat]
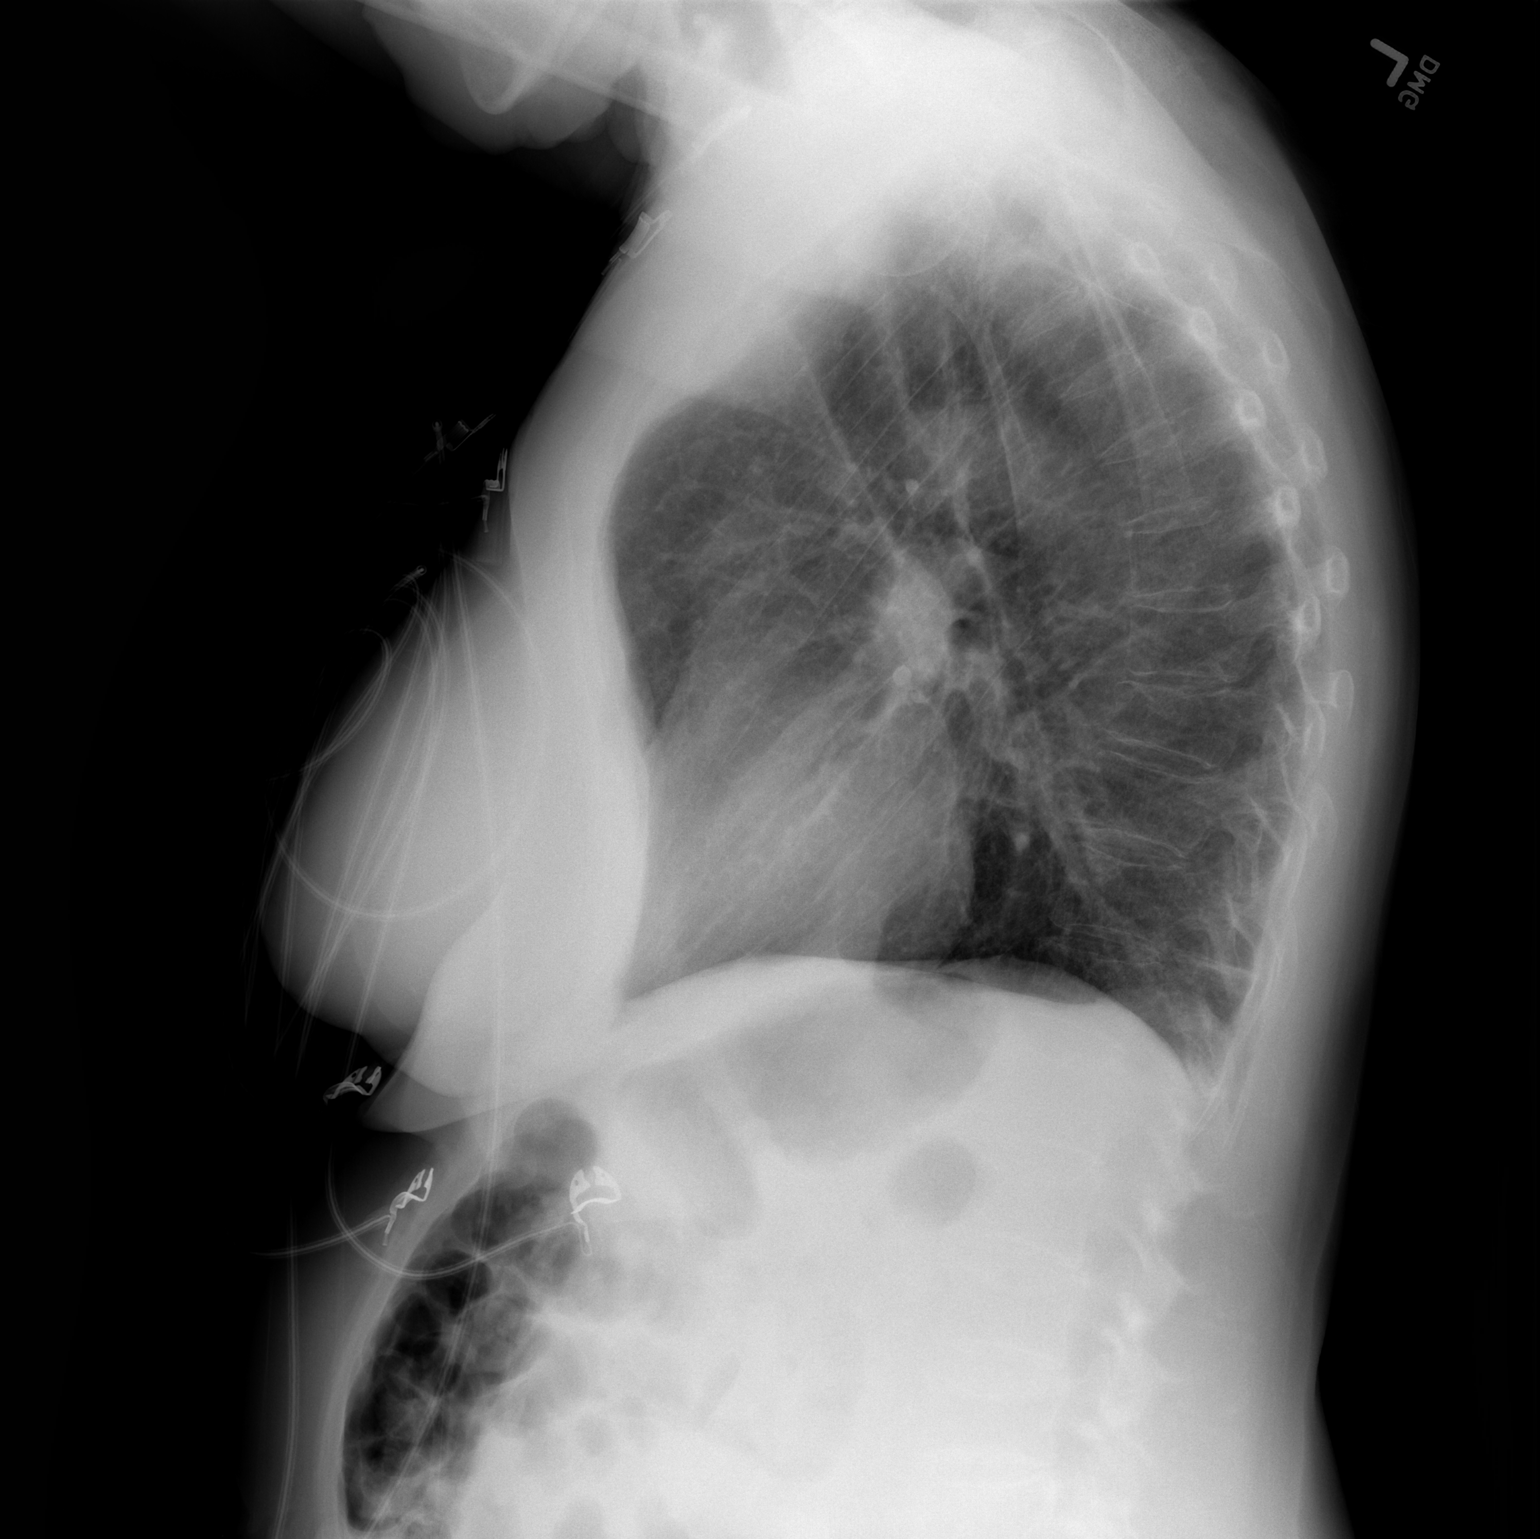

[2 of 2 positions shown; findings below may reference images not displayed]

FINDINGS: Heart and mediastinal contours are within normal limits. No focal
opacities or effusions. No acute bony abnormality.
IMPRESSION: No active cardiopulmonary disease.

## 2022-06-22 ENCOUNTER — Other Ambulatory Visit: Payer: Self-pay | Admitting: Interventional Radiology

## 2022-06-22 DIAGNOSIS — S22000A Wedge compression fracture of unspecified thoracic vertebra, initial encounter for closed fracture: Secondary | ICD-10-CM

## 2022-07-10 ENCOUNTER — Ambulatory Visit
Admission: RE | Admit: 2022-07-10 | Discharge: 2022-07-10 | Disposition: A | Discharge status: Home / Self Care | Payer: BC Managed Care – PPO | Source: Ambulatory Visit | Attending: Interventional Radiology | Admitting: Interventional Radiology

## 2022-07-10 DIAGNOSIS — S22000A Wedge compression fracture of unspecified thoracic vertebra, initial encounter for closed fracture: Secondary | ICD-10-CM

## 2022-07-10 NOTE — Progress Notes (Signed)
Patient ID: Sarah Kelly, female   DOB: 24-Mar-1961, 62 y.o.   MRN: [id number]        Chief Complaint: Post T6 and T7 KP  Referring Physician(s): Gredmarie Delange  History of Present Illness: Sarah Kelly is a 62 y.o. female with past medical history significant for hypertension, hyperlipidemia, cerebral aneurysm, prior CVA and chronic painwho underwent a technically successful fluoroscopic guided T6 and T7 kyphoplasties on 06/19/2022.  In review, patient presented to the [facility] emergency department on 06/14/2022 with worsening back pain for the preceding 1 to 2 weeks.  She denied history of fall or trauma.  She thinks she may have experienced the fractures while turning awkwardly while pulling her trash cans to the road.  Prior to the procedure, she described her pain as 10 out of 10, worse with activity.  She is seen today in telemedicine consultation for postprocedural evaluation and management.  The patient states that she is significantly improved since undergoing the kyphoplasty though has not yet returned to her preprocedural baseline.  She reports intermittent localized mid back pain as well as left scapular pain.  She has returned to most activities of daily living however continues to avoid lifting of heavy objects.   Past Medical History:  Diagnosis Date   Anxiety    Back pain    Concussion    Fracture of lumbar vertebra (HCC) L 5 fracture   Hypertension    Migraine    Stroke [facility])     Past Surgical History:  Procedure Laterality Date   BREAST SURGERY     TONSILECTOMY/ADENOIDECTOMY WITH MYRINGOTOMY      Allergies: Corticosteroids, Cymbalta [duloxetine hcl], Lexapro [escitalopram], Wellbutrin [bupropion], Zoloft [sertraline], and Zonisamide  Medications: Prior to Admission medications   Medication Sig Start Date End Date Taking? Authorizing Provider  amLODipine (NORVASC) 5 MG tablet Take 5 mg by mouth daily. 01/15/21   [provider]  Ascorbic Acid  (VITAMIN C PO) Take 1 tablet by mouth daily.    [provider]  Aspirin-Salicylamide-Caffeine (BC HEADACHE POWDER PO) Take 1 packet by mouth daily as needed (Headache).    [provider]  atorvastatin (LIPITOR) 10 MG tablet Take 10 mg by mouth daily. 01/06/21   [provider]  Hypromellose (ARTIFICIAL TEARS OP) Apply 1 drop to eye daily as needed (Dry eyes).    [provider]  lisinopril (ZESTRIL) 40 MG tablet Take 40 mg by mouth daily. 12/20/20   [provider]  meclizine (ANTIVERT) 25 MG tablet Take 25 mg by mouth 3 (three) times daily as needed (car sickness/motion sickness). 12/25/20   [provider]  Multiple Vitamin (MULTIVITAMIN WITH MINERALS) TABS tablet Take 1 tablet by mouth daily.    [provider]  NASAL SALINE NA Place 1 spray into the nose daily as needed (Nasal congestion, allergies).    [provider]  NUCYNTA ER 200 MG TB12 Take 200 mg by mouth every 12 (twelve) hours. 11/29/20   [provider]  ondansetron (ZOFRAN-ODT) 4 MG disintegrating tablet Take 4 mg by mouth every 8 (eight) hours as needed for nausea/vomiting. 12/24/20   [provider]  oxyCODONE-acetaminophen (PERCOCET/ROXICET) 5-325 MG tablet Take 1 tablet by mouth every 6 (six) hours as needed for pain. 01/03/21   [provider]  primidone (MYSOLINE) 250 MG tablet Take 250-500 mg by mouth See admin instructions. 250mg  in the morning 500mg  at night 12/29/20   [provider]  sertraline (ZOLOFT) 100 MG tablet Take 50 mg by  mouth daily. 01/02/21   [provider]  Thiamine HCl (VITAMIN B-1 PO) Take 1 tablet by mouth daily.    [provider]     Family History  Problem Relation Age of Onset   Cancer Father    Heart disease Paternal Grandfather     Social History   Socioeconomic History   Marital status: Single    Spouse name: Not on file   Number of children: Not on file   Years of  education: Not on file   Highest education level: Not on file  Occupational History   Not on file  Tobacco Use   Smoking status: Former   Smokeless tobacco: Never  Substance and Sexual Activity   Alcohol use: Yes   Drug use: No   Sexual activity: Not on file  Other Topics Concern   Not on file  Social History Narrative   Not on file   Social Determinants of Health   Financial Resource Strain: Not on file  Food Insecurity: Not on file  Transportation Needs: Not on file  Physical Activity: Not on file  Stress: Not on file  Social Connections: Not on file    ECOG Status: 1 - Symptomatic but completely ambulatory  Review of Systems  Review of Systems: A 12 point ROS discussed and pertinent positives are indicated in the HPI above.  All other systems are negative.    Physical Exam No direct physical exam was performed (except for noted visual exam findings with Video Visits).   Vital Signs: There were no vitals taken for this visit.  Imaging:  Fluoroscopic guided T6 and T7 kyphoplasty-06/19/2022  Provided images demonstrated a technically excellent result with appropriate filling of both vertebral bodies without evidence of nontarget cement embolization.  Labs:  CBC: No results for input(s): "WBC", "HGB", "HCT", "PLT" in the last 8760 hours.  COAGS: No results for input(s): "INR", "APTT" in the last 8760 hours.  BMP: No results for input(s): "NA", "K", "CL", "CO2", "GLUCOSE", "BUN", "CALCIUM", "CREATININE", "GFRNONAA", "GFRAA" in the last 8760 hours.  Invalid input(s): "CMP"  LIVER FUNCTION TESTS: No results for input(s): "BILITOT", "AST", "ALT", "ALKPHOS", "PROT", "ALBUMIN" in the last 8760 hours.  TUMOR MARKERS: No results for input(s): "AFPTM", "CEA", "CA199", "CHROMGRNA" in the last 8760 hours.  Assessment and Plan:  Sarah Kelly is a 62 y.o. female with past medical history significant for hypertension, hyperlipidemia, cerebral aneurysm, prior CVA  and chronic painwho underwent a technically successful fluoroscopic guided T6 and T7 kyphoplasties on 06/19/2022.  The patient states that she is significantly improved since undergoing the kyphoplasty though has not yet returned to her preprocedural baseline.  She reports intermittent localized mid back pain as well as left scapular pain.  She has returned to most activities of daily living however continues to avoid lifting of heavy objects.  Personal review of intraprocedural images from fluoroscopic guided kyphoplasty procedure performed 06/19/2022 demonstrates a technically excellent result with appropriate filling of both vertebral bodies without evidence of nontarget cement embolization.  I encouraged the patient to continue to progress her activities of daily living and that she has no specific restrictions regarding lifting or activities.  I explained to the patient as her fracture is developed insidiously, this may herald osteoporosis/osteopenia and as such she should follow-up with her primary care physician for dedicated evaluation potentially including the acquisition of the DEXA scan.  The patient states she is currently on over-the-counter bone supplementation medications and supplements but I stated that as she  has now experienced compression fractures, she may require prescription based bone health medications.  I explained to the patient that as she has experienced compression fractures, she may be at risk for future compression fractures and she should not hesitate to call the interventional radiology clinic if she were to develop recurrent symptoms in the future.  The patient may otherwise follow-up with interventional radiology clinic on a PRN basis.  A copy of this report was sent to the requesting provider on this date.  Electronically Signed: Sandi Mariscal 07/10/2022, 9:52 AM   I spent a total of 10 Minutes in remote  clinical consultation, greater than 50% of which was  counseling/coordinating care for post T6 and T7 KP.    Visit type: Audio only (telephone). Audio (no video) only due to patient's lack of internet/smartphone capability. Alternative for in-person consultation at [facility], [address] [place], [place], [place]. This visit type was conducted due to national recommendations for restrictions regarding the COVID-19 Pandemic (e.g. social distancing).  This format is felt to be most appropriate for this patient at this time.  All issues noted in this document were discussed and addressed.

## 2022-08-28 ENCOUNTER — Emergency Department (HOSPITAL_COMMUNITY): Payer: BC Managed Care – PPO

## 2022-08-28 ENCOUNTER — Ambulatory Visit
Admission: EM | Admit: 2022-08-28 | Discharge: 2022-08-28 | Disposition: A | Discharge status: Home / Self Care | Payer: BC Managed Care – PPO | Attending: Family Medicine | Admitting: Family Medicine

## 2022-08-28 ENCOUNTER — Encounter (HOSPITAL_COMMUNITY): Payer: Self-pay | Admitting: Emergency Medicine

## 2022-08-28 ENCOUNTER — Emergency Department (HOSPITAL_BASED_OUTPATIENT_CLINIC_OR_DEPARTMENT_OTHER): Payer: BC Managed Care – PPO

## 2022-08-28 ENCOUNTER — Other Ambulatory Visit: Payer: Self-pay

## 2022-08-28 ENCOUNTER — Emergency Department (HOSPITAL_COMMUNITY)
Admission: EM | Admit: 2022-08-28 | Discharge: 2022-08-28 | Disposition: A | Discharge status: Home / Self Care | Payer: BC Managed Care – PPO | Attending: Emergency Medicine | Admitting: Emergency Medicine

## 2022-08-28 DIAGNOSIS — R609 Edema, unspecified: Secondary | ICD-10-CM

## 2022-08-28 DIAGNOSIS — F17210 Nicotine dependence, cigarettes, uncomplicated: Secondary | ICD-10-CM | POA: Insufficient documentation

## 2022-08-28 DIAGNOSIS — R0902 Hypoxemia: Secondary | ICD-10-CM

## 2022-08-28 DIAGNOSIS — R0602 Shortness of breath: Secondary | ICD-10-CM | POA: Insufficient documentation

## 2022-08-28 DIAGNOSIS — R600 Localized edema: Secondary | ICD-10-CM | POA: Diagnosis present

## 2022-08-28 DIAGNOSIS — Z7982 Long term (current) use of aspirin: Secondary | ICD-10-CM | POA: Insufficient documentation

## 2022-08-28 DIAGNOSIS — M79609 Pain in unspecified limb: Secondary | ICD-10-CM | POA: Diagnosis not present

## 2022-08-28 LAB — CBC WITH DIFFERENTIAL/PLATELET
Abs Immature Granulocytes: 0.01 10*3/uL (ref 0.00–0.07)
Basophils Absolute: 0 10*3/uL (ref 0.0–0.1)
Basophils Relative: 1 %
Eosinophils Absolute: 0.1 10*3/uL (ref 0.0–0.5)
Eosinophils Relative: 3 %
HCT: 38.2 % (ref 36.0–46.0)
Hemoglobin: 12.4 g/dL (ref 12.0–15.0)
Immature Granulocytes: 0 %
Lymphocytes Relative: 30 %
Lymphs Abs: 1.4 10*3/uL (ref 0.7–4.0)
MCH: 34.2 pg — ABNORMAL HIGH (ref 26.0–34.0)
MCHC: 32.5 g/dL (ref 30.0–36.0)
MCV: 105.2 fL — ABNORMAL HIGH (ref 80.0–100.0)
Monocytes Absolute: 0.7 10*3/uL (ref 0.1–1.0)
Monocytes Relative: 15 %
Neutro Abs: 2.5 10*3/uL (ref 1.7–7.7)
Neutrophils Relative %: 51 %
Platelets: 311 10*3/uL (ref 150–400)
RBC: 3.63 MIL/uL — ABNORMAL LOW (ref 3.87–5.11)
RDW: 12.4 % (ref 11.5–15.5)
WBC: 4.7 10*3/uL (ref 4.0–10.5)
nRBC: 0 % (ref 0.0–0.2)

## 2022-08-28 LAB — BASIC METABOLIC PANEL
Anion gap: 8 (ref 5–15)
BUN: 6 mg/dL — ABNORMAL LOW (ref 8–23)
CO2: 30 mmol/L (ref 22–32)
Calcium: 8.6 mg/dL — ABNORMAL LOW (ref 8.9–10.3)
Chloride: 99 mmol/L (ref 98–111)
Creatinine, Ser: 0.58 mg/dL (ref 0.44–1.00)
GFR, Estimated: >60 mL/min (ref >60)
Glucose, Bld: 120 mg/dL — ABNORMAL HIGH (ref 70–99)
Potassium: 4.3 mmol/L (ref 3.5–5.1)
Sodium: 137 mmol/L (ref 135–145)

## 2022-08-28 LAB — TROPONIN I (HIGH SENSITIVITY)
Troponin I (High Sensitivity): 6 ng/L (ref <18)
Troponin I (High Sensitivity): 5 ng/L (ref <18)

## 2022-08-28 LAB — BRAIN NATRIURETIC PEPTIDE: B Natriuretic Peptide: 36.6 pg/mL (ref 0.0–100.0)

## 2022-08-28 MED ORDER — IOHEXOL 350 MG/ML SOLN
75.0000 mL | Freq: Once | INTRAVENOUS | Status: AC | PRN
  Administered 2022-08-28: 75 mL via INTRAVENOUS

## 2022-08-28 NOTE — ED Notes (Signed)
Patient transported to CT 

## 2022-08-28 NOTE — Discharge Instructions (Signed)
You were seen for lower extremity swelling in the emergency department.   At home, please use compression stockings for your swelling.    Follow-up with your primary doctor in 2-3 days regarding your visit.  Talk to them about resuming your hydrochlorothiazide for your swelling or if you need to be started on another fluid pill.  Please also talk to them about your amlodipine which can be associated with swelling.  Return immediately to the emergency department if you experience any of the following: Chest pain, shortness of breath, or any other concerning symptoms.    Thank you for visiting our Emergency Department. It was a pleasure taking care of you today.

## 2022-08-28 NOTE — Discharge Instructions (Signed)
Please proceed to the ER for further evaluation.  

## 2022-08-28 NOTE — ED Triage Notes (Addendum)
Pt c/o bilateral leg swelling and knee pain for 3 days. Pt also noticed her room air o2 level was 88% at home. Prescribed home o2 at night and as needed during day. Endorses episode of chest heaviness this morning during exertion. Denies shortness of breath at rest but endorses some shob with exertion.

## 2022-08-28 NOTE — ED Triage Notes (Signed)
Triaged by physician, to be sent to ED due to O2 at 87%

## 2022-08-28 NOTE — ED Provider Triage Note (Signed)
Emergency Medicine Provider Triage Evaluation Note  Sarah Kelly , a 62 y.o. female  was evaluated in triage.  Pt complains of right-sided leg swelling, chest pressure, hypoxia.  Notices swelling and calf pain yesterday.  It has gotten worse.  Had lumbar spine surgery in January.  Initially presented to urgent care but was encouraged to come here due to hypoxia.  Typically 94% on room air, however she does have as needed oxygen prescription for at home.  Review of Systems  Positive: As above Negative: As above  Physical Exam  BP 120/77 (BP Location: Right Arm)   Pulse 95   Temp 98.4 F (36.9 C) (Oral)   Resp 16   Ht 5\' 2"  (1.575 m)   Wt 61.2 kg   SpO2 (!) 89%   BMI 24.69 kg/m  Gen:   Awake, no distress   Resp:  Normal effort  MSK:   Moves extremities without difficulty  Other:  Right lower extremity slightly larger than left.  1+ pitting edema bilaterally  Medical Decision Making  Medically screening exam initiated at 4:51 PM.  Appropriate orders placed.  Berit Kinderman was informed that the remainder of the evaluation will be completed by another provider, this initial triage assessment does not replace that evaluation, and the importance of remaining in the ED until their evaluation is complete.  ACS rule out and DVT study ordered   Roylene Reason, Hershal Coria 08/28/22 1653

## 2022-08-28 NOTE — Progress Notes (Signed)
Lower extremity venous bilateral study completed.  Preliminary results relayed to [place], [place].   See CV Proc for preliminary results report.   Darlin Coco, RDMS, RVT

## 2022-08-28 NOTE — ED Provider Notes (Signed)
[name]    CSN: [id number] Arrival date & time: 08/28/22  1502      History   Chief Complaint Chief Complaint  Patient presents with   Shortness of Breath    HPI Sarah Kelly is a 62 y.o. female.    Shortness of Breath  Here for leg swelling and pressure chest.  She is also felt a little short of breath today.  The leg swelling began about 3 days ago.  No fever or cough  She does use oxygen night.  She does not usually have to use it during the daytime, and her last doctors visit her O2 sat was 94%.  Had lumbar surgery in the last 2 or 3 months.  Past Medical History:  Diagnosis Date   Anxiety    Back pain    Concussion    Fracture of lumbar vertebra (HCC) L 5 fracture   Hypertension    Migraine    Stroke [facility])     Patient Active Problem List   Diagnosis Date Noted   Hyponatremia 01/20/2021   HTN (hypertension) 01/20/2021   Chest pain 01/19/2021    Past Surgical History:  Procedure Laterality Date   BREAST SURGERY     TONSILECTOMY/ADENOIDECTOMY WITH MYRINGOTOMY      OB History   No obstetric history on file.      Home Medications    Prior to Admission medications   Medication Sig Start Date End Date Taking? Authorizing Provider  amLODipine (NORVASC) 5 MG tablet Take 5 mg by mouth daily. 01/15/21   [provider]  Ascorbic Acid (VITAMIN C PO) Take 1 tablet by mouth daily.    [provider]  Aspirin-Salicylamide-Caffeine (BC HEADACHE POWDER PO) Take 1 packet by mouth daily as needed (Headache).    [provider]  atorvastatin (LIPITOR) 10 MG tablet Take 10 mg by mouth daily. 01/06/21   [provider]  Hypromellose (ARTIFICIAL TEARS OP) Apply 1 drop to eye daily as needed (Dry eyes).    [provider]  lisinopril (ZESTRIL) 40 MG tablet Take 40 mg by mouth daily. 12/20/20   [provider]  meclizine (ANTIVERT) 25 MG tablet Take 25 mg by mouth 3 (three) times daily as needed (car  sickness/motion sickness). 12/25/20   [provider]  Multiple Vitamin (MULTIVITAMIN WITH MINERALS) TABS tablet Take 1 tablet by mouth daily.    [provider]  NASAL SALINE NA Place 1 spray into the nose daily as needed (Nasal congestion, allergies).    [provider]  NUCYNTA ER 200 MG TB12 Take 200 mg by mouth every 12 (twelve) hours. 11/29/20   [provider]  ondansetron (ZOFRAN-ODT) 4 MG disintegrating tablet Take 4 mg by mouth every 8 (eight) hours as needed for nausea/vomiting. 12/24/20   [provider]  oxyCODONE-acetaminophen (PERCOCET/ROXICET) 5-325 MG tablet Take 1 tablet by mouth every 6 (six) hours as needed for pain. 01/03/21   [provider]  primidone (MYSOLINE) 250 MG tablet Take 250-500 mg by mouth See admin instructions. 250mg  in the morning 500mg  at night 12/29/20   [provider]  sertraline (ZOLOFT) 100 MG tablet Take 50 mg by mouth daily. 01/02/21   [provider]  Thiamine HCl (VITAMIN B-1 PO) Take 1 tablet by mouth daily.    [provider]    Family History Family History  Problem Relation Age of Onset   Cancer Father    Heart disease Paternal Grandfather  Social History Social History   Tobacco Use   Smoking status: Former   Smokeless tobacco: Never  Substance Use Topics   Alcohol use: Yes   Drug use: No     Allergies   Corticosteroids, Cymbalta [duloxetine hcl], Lexapro [escitalopram], Wellbutrin [bupropion], Zoloft [sertraline], and Zonisamide   Review of Systems Review of Systems  Respiratory:  Positive for shortness of breath.      Physical Exam Triage Vital Signs ED Triage Vitals [08/28/22 1531]  Enc Vitals Group     BP (!) 150/79     Pulse Rate (!) 104     Resp 16     Temp 98.3 F (36.8 C)     Temp Source Oral     SpO2 (!) 88 %     Weight      Height      Head Circumference      Peak Flow      Pain Score      Pain Loc      Pain Edu?       Excl. in [place]?    No data found.  Updated Vital Signs BP (!) 150/79 (BP Location: Left Arm)   Pulse (!) 104   Temp 98.3 F (36.8 C) (Oral)   Resp 16   SpO2 (!) 88%   Visual Acuity Right Eye Distance:   Left Eye Distance:   Bilateral Distance:    Right Eye Near:   Left Eye Near:    Bilateral Near:     Physical Exam Vitals reviewed.  Constitutional:      General: She is not in acute distress.    Appearance: She is not ill-appearing, toxic-appearing or diaphoretic.     Comments: She is not diaphoretic but she is pale.  HENT:     Nose: Nose normal.     Mouth/Throat:     Mouth: Mucous membranes are moist.     Pharynx: No oropharyngeal exudate or posterior oropharyngeal erythema.  Eyes:     Extraocular Movements: Extraocular movements intact.     Conjunctiva/sclera: Conjunctivae normal.     Pupils: Pupils are equal, round, and reactive to light.  Cardiovascular:     Rate and Rhythm: Regular rhythm. Tachycardia present.     Heart sounds: No murmur heard. Pulmonary:     Effort: Pulmonary effort is normal. No respiratory distress.     Breath sounds: No stridor. No wheezing.  Musculoskeletal:     Cervical back: Neck supple.  Lymphadenopathy:     Cervical: No cervical adenopathy.  Skin:    Coloration: Skin is not pale.     Findings: No bruising.  Neurological:     General: No focal deficit present.     Mental Status: She is alert and oriented to person, place, and time.  Psychiatric:        Behavior: Behavior normal.      UC Treatments / Results  Labs (all labs ordered are listed, but only abnormal results are displayed) Labs Reviewed - No data to display  EKG   Radiology No results found.  Procedures Procedures (including critical care time)  Medications Ordered in UC Medications - No data to display  Initial Impression / Assessment and Plan / UC Course  I have reviewed the triage vital signs and the nursing notes.  Pertinent labs & imaging results  that were available during my care of the patient were reviewed by me and considered in my medical decision making (see chart for details).  Her sat here is 88 to 90% on room air.  Her color is not good.  I have asked her to proceed by private car to the emergency room for further evaluation.  Her friend is driving. Final Clinical Impressions(s) / UC Diagnoses   Final diagnoses:  Hypoxia     Discharge Instructions      Please proceed to the ER for further evaluation    ED Prescriptions   None    PDMP not reviewed this encounter.   Barrett Henle, MD 08/28/22 [phone]

## 2022-08-28 NOTE — ED Provider Notes (Signed)
[facility] Provider Note   CSN: [id number] Arrival date & time: 08/28/22  1622     History  Chief Complaint  Patient presents with   Leg Swelling    Sarah Kelly is a 62 y.o. female.  62 year old female with history of chronic hypoxia on oxygen at night and lumbar vertebral kyphoplasty who presents to the emergency department with leg swelling.  Says that on Monday started experiencing bilateral lower extremity swelling.  Feels that the right is worse than the left and started becoming painful.  Had back surgery in January and was concerned about a blood clot so she came into the emergency department for evaluation.  Says that she is having shortness of breath that is somewhat worse than her usual.  Has been trying an inhaler without improvement.  No cough, runny nose, sore throat, or fevers.  Also has started experiencing nonexertional chest pressure that is substernal.  No diaphoresis.  No radiation.  No personal history of MI.  No immediate family members with history of MI.  Does smoke cigarettes.       Home Medications Prior to Admission medications   Medication Sig Start Date End Date Taking? Authorizing Provider  amLODipine (NORVASC) 5 MG tablet Take 5 mg by mouth daily. 01/15/21   [provider]  Ascorbic Acid (VITAMIN C PO) Take 1 tablet by mouth daily.    [provider]  Aspirin-Salicylamide-Caffeine (BC HEADACHE POWDER PO) Take 1 packet by mouth daily as needed (Headache).    [provider]  atorvastatin (LIPITOR) 10 MG tablet Take 10 mg by mouth daily. 01/06/21   [provider]  Hypromellose (ARTIFICIAL TEARS OP) Apply 1 drop to eye daily as needed (Dry eyes).    [provider]  lisinopril (ZESTRIL) 40 MG tablet Take 40 mg by mouth daily. 12/20/20   [provider]  meclizine (ANTIVERT) 25 MG tablet Take 25 mg by mouth 3 (three) times daily as needed (car sickness/motion  sickness). 12/25/20   [provider]  Multiple Vitamin (MULTIVITAMIN WITH MINERALS) TABS tablet Take 1 tablet by mouth daily.    [provider]  NASAL SALINE NA Place 1 spray into the nose daily as needed (Nasal congestion, allergies).    [provider]  NUCYNTA ER 200 MG TB12 Take 200 mg by mouth every 12 (twelve) hours. 11/29/20   [provider]  ondansetron (ZOFRAN-ODT) 4 MG disintegrating tablet Take 4 mg by mouth every 8 (eight) hours as needed for nausea/vomiting. 12/24/20   [provider]  oxyCODONE-acetaminophen (PERCOCET/ROXICET) 5-325 MG tablet Take 1 tablet by mouth every 6 (six) hours as needed for pain. 01/03/21   [provider]  primidone (MYSOLINE) 250 MG tablet Take 250-500 mg by mouth See admin instructions. 250mg  in the morning 500mg  at night 12/29/20   [provider]  sertraline (ZOLOFT) 100 MG tablet Take 50 mg by mouth daily. 01/02/21   [provider]  Thiamine HCl (VITAMIN B-1 PO) Take 1 tablet by mouth daily.    [provider]      Allergies    Corticosteroids, Cymbalta [duloxetine hcl], Lexapro [escitalopram], Wellbutrin [bupropion], Zoloft [sertraline], and Zonisamide    Review of Systems   Review of Systems  Physical Exam Updated Vital Signs BP 118/78   Pulse 75   Temp 98.2 F (36.8 C) (Oral)   Resp 18   Ht 5\' 2"  (1.575 m)   Wt 61.2 kg   SpO2  97%   BMI 24.69 kg/m  Physical Exam Vitals and nursing note reviewed.  Constitutional:      General: She is not in acute distress.    Appearance: She is well-developed.  HENT:     Head: Normocephalic and atraumatic.     Right Ear: External ear normal.     Left Ear: External ear normal.     Nose: Nose normal.  Eyes:     Extraocular Movements: Extraocular movements intact.     Conjunctiva/sclera: Conjunctivae normal.     Pupils: Pupils are equal, round, and reactive to light.  Cardiovascular:     Rate and Rhythm: Normal rate  and regular rhythm.     Heart sounds: No murmur heard. Pulmonary:     Effort: Pulmonary effort is normal. No respiratory distress.     Breath sounds: Normal breath sounds.     Comments: Satting 90% on room air Abdominal:     General: Abdomen is flat. There is no distension.     Palpations: Abdomen is soft. There is no mass.     Tenderness: There is no abdominal tenderness. There is no guarding.  Musculoskeletal:     Cervical back: Normal range of motion and neck supple.     Right lower leg: Edema present.     Left lower leg: Edema present.  Skin:    General: Skin is warm and dry.  Neurological:     Mental Status: She is alert and oriented to person, place, and time. Mental status is at baseline.  Psychiatric:        Mood and Affect: Mood normal.     ED Results / Procedures / Treatments   Labs (all labs ordered are listed, but only abnormal results are displayed) Labs Reviewed  BASIC METABOLIC PANEL - Abnormal; Notable for the following components:      Result Value   Glucose, Bld 120 (*)    BUN 6 (*)    Calcium 8.6 (*)    All other components within normal limits  CBC WITH DIFFERENTIAL/PLATELET - Abnormal; Notable for the following components:   RBC 3.63 (*)    MCV 105.2 (*)    MCH 34.2 (*)    All other components within normal limits  BRAIN NATRIURETIC PEPTIDE  TROPONIN I (HIGH SENSITIVITY)  TROPONIN I (HIGH SENSITIVITY)    EKG EKG Interpretation  Date/Time:  Friday August 28 2022 16:27:25 EDT Ventricular Rate:  89 PR Interval:  164 QRS Duration: 74 QT Interval:  336 QTC Calculation: 408 R Axis:   -40 Text Interpretation: Normal sinus rhythm Left axis deviation Anterior infarct , age undetermined Abnormal ECG When compared with ECG of 19-Jan-2021 17:52, No significant change since last tracing Confirmed by Margaretmary Eddy [phone]) on 08/28/2022 8:53:19 PM  Radiology CT Angio Chest PE W and/or Wo Contrast  Result Date: 08/28/2022 CLINICAL DATA:  Shortness of  breath and leg swelling EXAM: CT ANGIOGRAPHY CHEST WITH CONTRAST TECHNIQUE: Multidetector CT imaging of the chest was performed using the standard protocol during bolus administration of intravenous contrast. Multiplanar CT image reconstructions and MIPs were obtained to evaluate the vascular anatomy. RADIATION DOSE REDUCTION: This exam was performed according to the departmental dose-optimization program which includes automated exposure control, adjustment of the mA and/or kV according to patient size and/or use of iterative reconstruction technique. CONTRAST:  50mL OMNIPAQUE IOHEXOL 350 MG/ML SOLN COMPARISON:  Chest x-ray 08/28/2022, CT spine 06/14/2022 FINDINGS: Cardiovascular: Satisfactory opacification of the pulmonary arteries to the segmental level. No evidence  of pulmonary embolism. Nonaneurysmal aorta. Mild atherosclerosis. No pericardial effusion Mediastinum/Nodes: Midline trachea. No thyroid mass. No suspicious lymph nodes. Esophagus within normal limits. Lungs/Pleura: Emphysema. Bandlike and hazy densities at the posterior lung bases favor subsegmental atelectasis. No consolidation, pleural effusion or pneumothorax Upper Abdomen: No acute abnormality. Musculoskeletal: Bilateral breast implants. Interval vertebral augmentation at T6 and T7. Mild inferior endplate deformity at T5 is new compared with December 2023. Less than 20% loss of vertebral body height. Chronic mild superior endplate deformity at T3. Chronic compression deformity at T9, superior endplate of 624THL and superior endplate of L2. Review of the MIP images confirms the above findings. IMPRESSION: 1. Negative for acute pulmonary embolus. 2. Emphysema. Bandlike and hazy densities at the posterior lung bases favored to represent subsegmental atelectasis. 3. Interval vertebral augmentation at T6 and T7. New mild compression deformity at T5. Emphysema (ICD10-J43.9). Aortic Atherosclerosis (ICD10-I70.0). Electronically Signed   By: Donavan Foil  M.D.   On: 08/28/2022 22:46   VAS [place] LOWER EXTREMITY VENOUS (DVT) (7a-7p)  Result Date: 08/28/2022  Lower Venous DVT Study Patient Name:  Sarah Kelly  Date of Exam:   08/28/2022 Medical Rec #: [id number]        Accession #:    [id number] Date of Birth: [birth year]       Patient Gender: F Patient Age:   42 years Exam Location:  [facility] Procedure:      VAS [place] LOWER EXTREMITY VENOUS (DVT) Referring Phys: Sheppard Coil SCHUTT --------------------------------------------------------------------------------  Indications: Leg pain and edema, right > left.  Comparison Study: No prior studies. Performing Technologist: Darlin Coco RDMS, RVT  Examination Guidelines: A complete evaluation includes B-mode imaging, spectral Doppler, color Doppler, and power Doppler as needed of all accessible portions of each vessel. Bilateral testing is considered an integral part of a complete examination. Limited examinations for reoccurring indications may be performed as noted. The reflux portion of the exam is performed with the patient in reverse Trendelenburg.  +---------+---------------+---------+-----------+----------+--------------+ RIGHT    CompressibilityPhasicitySpontaneityPropertiesThrombus Aging +---------+---------------+---------+-----------+----------+--------------+ CFV      Full           Yes      Yes                                 +---------+---------------+---------+-----------+----------+--------------+ SFJ      Full                                                        +---------+---------------+---------+-----------+----------+--------------+ FV Prox  Full                                                        +---------+---------------+---------+-----------+----------+--------------+ FV Mid   Full                                                        +---------+---------------+---------+-----------+----------+--------------+ FV DistalFull                                                         +---------+---------------+---------+-----------+----------+--------------+  PFV      Full                                                        +---------+---------------+---------+-----------+----------+--------------+ POP      Full           Yes      Yes                                 +---------+---------------+---------+-----------+----------+--------------+ PTV      Full                                                        +---------+---------------+---------+-----------+----------+--------------+ PERO     Full                                                        +---------+---------------+---------+-----------+----------+--------------+ Gastroc  Full                                                        +---------+---------------+---------+-----------+----------+--------------+   +---------+---------------+---------+-----------+----------+--------------+ LEFT     CompressibilityPhasicitySpontaneityPropertiesThrombus Aging +---------+---------------+---------+-----------+----------+--------------+ CFV      Full           Yes      Yes                                 +---------+---------------+---------+-----------+----------+--------------+ SFJ      Full                                                        +---------+---------------+---------+-----------+----------+--------------+ FV Prox  Full                                                        +---------+---------------+---------+-----------+----------+--------------+ FV Mid   Full                                                        +---------+---------------+---------+-----------+----------+--------------+ FV DistalFull                                                        +---------+---------------+---------+-----------+----------+--------------+  PFV      Full                                                         +---------+---------------+---------+-----------+----------+--------------+ POP      Full           Yes      Yes                                 +---------+---------------+---------+-----------+----------+--------------+ PTV      Full                                                        +---------+---------------+---------+-----------+----------+--------------+ PERO     Full                                                        +---------+---------------+---------+-----------+----------+--------------+ Gastroc  Full                                                        +---------+---------------+---------+-----------+----------+--------------+     Summary: RIGHT: - There is no evidence of deep vein thrombosis in the lower extremity.  - A large cystic structure is found in the popliteal fossa. 6.1 x 3.2 x 2.6 cm.  LEFT: - There is no evidence of deep vein thrombosis in the lower extremity.  - A cystic structure is found in the popliteal fossa. 1.7 x 1.6 x 1.7 cm.  *See table(s) above for measurements and observations.    Preliminary    DG Chest 2 View  Result Date: 08/28/2022 CLINICAL DATA:  Shortness of breath and leg swelling. EXAM: CHEST - 2 VIEW COMPARISON:  06/20/2022 FINDINGS: Lungs are hyperexpanded. The cardio pericardial silhouette is enlarged. Interstitial markings are diffusely coarsened with chronic features. Nodular density/densities projecting over the lungs are compatible with pads for telemetry leads. Streaky opacity posterior left base is slightly more conspicuous than on prior two view chest x-ray 01/19/2021. There is some scarring in this region on CT of 04/10/2022 and two-view chest x-ray of 01/19/2021. Status post 2 level vertebral augmentation. IMPRESSION: Emphysema without focal consolidation or pleural effusion. Streaky opacity overlying the lower spine on the lateral film is probably chronic atelectasis or scarring. Pneumonia considered less likely but not  entirely excluded. Electronically Signed   By: Misty Stanley M.D.   On: 08/28/2022 18:16    Procedures Procedures   Medications Ordered in ED Medications  iohexol (OMNIPAQUE) 350 MG/ML injection 75 mL (75 mLs Intravenous Contrast Given 08/28/22 2233)    ED Course/ Medical Decision Making/ A&P                             Medical Decision Making Amount  and/or Complexity of Data Reviewed Radiology: ordered.  Risk Prescription drug management.   Arretta Oriol is a 62 y.o. female with comorbidities that complicate the patient evaluation including chronic hypoxia on oxygen at night and recent lumbar vertebral kyphoplasty who presents emergency department for leg swelling, chest tightness, and shortness of breath  Initial Ddx:  DVT, PE, MI, heart failure, pneumonia, venous stasis  MDM:  Concern the patient could potentially have a DVT and PE given her recent surgery and symptoms.  Duplex ultrasound ordered from triage.  Will obtain CTA of the chest at this time as well.  This was also evaluate for pneumonia.  Will send off lab work to assess for heart failure with her lower extremity swelling.  With her chest discomfort will send EKG and troponins as well.    Plan:  Labs Troponin BNP CTA Lower extremity ultrasound  ED Summary/Re-evaluation:  Patient reassessed in the emergency department was stable.  Did not require any supplemental oxygen.  No evidence of DVT on her ultrasound and CTA without any acute findings.  Suspect that this is likely due to venous stasis.  Will have her follow-up with her primary doctor to see if she needs to be started on a diuretic and have her amlodipine discontinued.  Counseled to use compression stockings for her swelling at this time.  This patient presents to the ED for concern of complaints listed in HPI, this involves an extensive number of treatment options, and is a complaint that carries with it a high risk of complications and morbidity.  Disposition including potential need for admission considered.   Dispo: DC Home. Return precautions discussed including, but not limited to, those listed in the AVS. Allowed pt time to ask questions which were answered fully prior to dc.  Records reviewed Outpatient Clinic Notes The following labs were independently interpreted: Chemistry and show no acute abnormality I independently reviewed the following imaging with scope of interpretation limited to determining acute life threatening conditions related to emergency care: CT Chest and agree with the radiologist interpretation with the following exceptions: none I personally reviewed and interpreted cardiac monitoring: normal sinus rhythm  I personally reviewed and interpreted the pt's EKG: see above for interpretation  I have reviewed the patients home medications and made adjustments as needed  Final Clinical Impression(s) / ED Diagnoses Final diagnoses:  Leg edema    Rx / DC Orders ED Discharge Orders     None         Fransico Meadow, MD 08/28/22 2326

## 2022-11-01 ENCOUNTER — Other Ambulatory Visit: Payer: Self-pay

## 2022-11-01 ENCOUNTER — Emergency Department (HOSPITAL_COMMUNITY)
Admission: EM | Admit: 2022-11-01 | Discharge: 2022-11-01 | Disposition: A | Discharge status: Home / Self Care | Payer: BC Managed Care – PPO | Attending: Emergency Medicine | Admitting: Emergency Medicine

## 2022-11-01 ENCOUNTER — Emergency Department (HOSPITAL_COMMUNITY): Payer: BC Managed Care – PPO

## 2022-11-01 ENCOUNTER — Encounter (HOSPITAL_COMMUNITY): Payer: Self-pay

## 2022-11-01 DIAGNOSIS — Z8673 Personal history of transient ischemic attack (TIA), and cerebral infarction without residual deficits: Secondary | ICD-10-CM | POA: Insufficient documentation

## 2022-11-01 DIAGNOSIS — I10 Essential (primary) hypertension: Secondary | ICD-10-CM | POA: Diagnosis present

## 2022-11-01 DIAGNOSIS — Z79899 Other long term (current) drug therapy: Secondary | ICD-10-CM | POA: Diagnosis not present

## 2022-11-01 LAB — CBC WITH DIFFERENTIAL/PLATELET
Abs Immature Granulocytes: 0.02 10*3/uL (ref 0.00–0.07)
Basophils Absolute: 0.1 10*3/uL (ref 0.0–0.1)
Basophils Relative: 1 %
Eosinophils Absolute: 0.1 10*3/uL (ref 0.0–0.5)
Eosinophils Relative: 2 %
HCT: 38.8 % (ref 36.0–46.0)
Hemoglobin: 13 g/dL (ref 12.0–15.0)
Immature Granulocytes: 0 %
Lymphocytes Relative: 28 %
Lymphs Abs: 1.9 10*3/uL (ref 0.7–4.0)
MCH: 34 pg (ref 26.0–34.0)
MCHC: 33.5 g/dL (ref 30.0–36.0)
MCV: 101.6 fL — ABNORMAL HIGH (ref 80.0–100.0)
Monocytes Absolute: 0.9 10*3/uL (ref 0.1–1.0)
Monocytes Relative: 14 %
Neutro Abs: 3.8 10*3/uL (ref 1.7–7.7)
Neutrophils Relative %: 55 %
Platelets: 310 10*3/uL (ref 150–400)
RBC: 3.82 MIL/uL — ABNORMAL LOW (ref 3.87–5.11)
RDW: 12.3 % (ref 11.5–15.5)
WBC: 6.9 10*3/uL (ref 4.0–10.5)
nRBC: 0 % (ref 0.0–0.2)

## 2022-11-01 LAB — COMPREHENSIVE METABOLIC PANEL
ALT: 19 U/L (ref 0–44)
AST: 22 U/L (ref 15–41)
Albumin: 3.5 g/dL (ref 3.5–5.0)
Alkaline Phosphatase: 131 U/L — ABNORMAL HIGH (ref 38–126)
Anion gap: 11 (ref 5–15)
BUN: 11 mg/dL (ref 8–23)
CO2: 25 mmol/L (ref 22–32)
Calcium: 8.5 mg/dL — ABNORMAL LOW (ref 8.9–10.3)
Chloride: 97 mmol/L — ABNORMAL LOW (ref 98–111)
Creatinine, Ser: 0.74 mg/dL (ref 0.44–1.00)
GFR, Estimated: >60 mL/min (ref >60)
Glucose, Bld: 90 mg/dL (ref 70–99)
Potassium: 3.8 mmol/L (ref 3.5–5.1)
Sodium: 133 mmol/L — ABNORMAL LOW (ref 135–145)
Total Bilirubin: 0.4 mg/dL (ref 0.3–1.2)
Total Protein: 6.6 g/dL (ref 6.5–8.1)

## 2022-11-01 LAB — BRAIN NATRIURETIC PEPTIDE: B Natriuretic Peptide: 24.6 pg/mL (ref 0.0–100.0)

## 2022-11-01 LAB — TROPONIN I (HIGH SENSITIVITY)
Troponin I (High Sensitivity): 6 ng/L (ref <18)
Troponin I (High Sensitivity): 5 ng/L (ref <18)

## 2022-11-01 NOTE — ED Provider Notes (Signed)
[place] EMERGENCY DEPARTMENT AT [facility] Provider Note   CSN: [id number] Arrival date & time: 11/01/22  1833     History  Chief Complaint  Patient presents with   Hypertension    Sarah Kelly is a 62 y.o. female.  Patient is a 62 year old female with a history of prior cerebral aneurysm status post coil and then had to get clipped after getting a clot in it and having a stroke, hypertension, anxiety, migraines who is presenting today due to concern for elevated blood pressure.  Patient has been compliant with her blood pressure medications but since Wednesday has started noticing headaches, myalgias, fatigue, intermittent chest pain and shortness of breath that has been waxing and waning related to when her blood pressure is elevated.  Yesterday and today and has been the highest up to 170 systolic at home.  Yesterday and today she took an additional half of her lisinopril and amlodipine which she felt helped yesterday but was not helping today.  She currently reports now she feels great the best she has felt since Wednesday.  She has no specific complaints at this time.  No recent new medication changes, food changes or increased stress.  Unclear why her blood pressure is more elevated now.  The history is provided by the patient.  Hypertension       Home Medications Prior to Admission medications   Medication Sig Start Date End Date Taking? Authorizing Provider  albuterol (VENTOLIN HFA) 108 (90 Base) MCG/ACT inhaler Inhale 2 puffs into the lungs every 6 (six) hours as needed for wheezing or shortness of breath.   Yes [provider]  amLODipine (NORVASC) 5 MG tablet Take 5 mg by mouth daily.   Yes [provider]  Aspirin-Salicylamide-Caffeine (BC HEADACHE POWDER PO) Take 1 packet by mouth daily as needed (Headache).   Yes [provider]  atorvastatin (LIPITOR) 10 MG tablet Take 10 mg by mouth every evening. 01/06/21  Yes [provider]  DOXYCYCLINE HYCLATE PO Take 100 mg by mouth daily.   Yes [provider]  furosemide (LASIX) 20 MG tablet Take 20 mg by mouth 3 (three) times a week. Take on MWF   Yes [provider]  hydrOXYzine (ATARAX) 25 MG tablet Take 25 mg by mouth every 8 (eight) hours as needed for anxiety or itching. 04/14/22  Yes [provider]  Hypromellose (ARTIFICIAL TEARS OP) Apply 1 drop to eye daily as needed (Dry eyes).   Yes [provider]  lisinopril (ZESTRIL) 40 MG tablet Take 40 mg by mouth daily. 12/20/20  Yes [provider]  meclizine (ANTIVERT) 25 MG tablet Take 25 mg by mouth 3 (three) times daily as needed (car sickness/motion sickness). 12/25/20  Yes [provider]  Multiple Vitamin (MULTIVITAMIN WITH MINERALS) TABS tablet Take 1 tablet by mouth daily.   Yes [provider]  naloxone (NARCAN) nasal spray 4 mg/0.1 mL Place 1 spray into the nose as needed (accidental overdose). 08/24/22  Yes [provider]  NASAL SALINE NA Place 1 spray into the nose daily as needed (Nasal congestion, allergies).   Yes [provider]  omeprazole (PRILOSEC) 20 MG capsule Take 20 mg by mouth daily as needed (indigestion).   Yes [provider]  ondansetron (ZOFRAN-ODT) 4 MG disintegrating tablet Take 4 mg by mouth every 8 (eight) hours as needed for nausea/vomiting. 12/24/20  Yes [provider]  oxybutynin (DITROPAN-XL) 5 MG 24 hr tablet Take 1 tablet by mouth  daily. 09/29/22 09/29/23 Yes [provider]  oxyCODONE-acetaminophen (PERCOCET/ROXICET) 5-325 MG tablet Take 1 tablet by mouth every 6 (six) hours as needed for pain. 01/03/21  Yes [provider]  primidone (MYSOLINE) 250 MG tablet Take 375 mg by mouth in the morning and at bedtime. Take 1.5 tablets (375 mg) BID 12/29/20  Yes [provider]  sertraline (ZOLOFT) 100 MG tablet Take 50 mg by mouth at bedtime. 01/02/21  Yes [provider]  Tapentadol HCl (NUCYNTA ER) 200 MG TB12 Take 1 tablet by mouth in the morning and at bedtime.   Yes [provider]  tiotropium (SPIRIVA) 18 MCG inhalation capsule Place 18 mcg into inhaler and inhale daily. 10/05/22 10/05/23 Yes [provider]  zolpidem (AMBIEN) 5 MG tablet Take 5 mg by mouth at bedtime as needed for sleep. 07/15/22  Yes [provider]      Allergies    Corticosteroids, Cymbalta [duloxetine hcl], Lexapro [escitalopram], Wellbutrin [bupropion], Zoloft [sertraline], and Zonisamide    Review of Systems   Review of Systems  Physical Exam Updated Vital Signs BP 113/68   Pulse 71   Temp 98.2 F (36.8 C) (Oral)   Resp 17   Ht 5\' 3"  (1.6 m)   Wt 61.7 kg   SpO2 95%   BMI 24.09 kg/m  Physical Exam Vitals and nursing note reviewed.  Constitutional:      General: She is not in acute distress.    Appearance: Normal appearance. She is well-developed.  HENT:     Head: Normocephalic and atraumatic.  Eyes:     Pupils: Pupils are equal, round, and reactive to light.  Cardiovascular:     Rate and Rhythm: Normal rate and regular rhythm.     Heart sounds: Normal heart sounds. No murmur heard.    No friction rub.  Pulmonary:     Effort: Pulmonary effort is normal.     Breath sounds: Normal breath sounds. No wheezing or rales.  Abdominal:     General: Bowel sounds are normal. There is no distension.     Palpations: Abdomen is soft.     Tenderness: There is no abdominal tenderness. There is no guarding or rebound.  Musculoskeletal:        General: No tenderness. Normal range of motion.     Comments: No edema  Skin:    General: Skin is warm and dry.     Findings: No rash.  Neurological:     Mental Status: She is alert and oriented to person, place, and time. Mental status is at baseline.     Cranial Nerves: No cranial nerve deficit.     Sensory: No sensory deficit.     Motor: No weakness.     Coordination: Coordination normal.   Psychiatric:        Mood and Affect: Mood normal.        Behavior: Behavior normal.     ED Results / Procedures / Treatments   Labs (all labs ordered are listed, but only abnormal results are displayed) Labs Reviewed  CBC WITH DIFFERENTIAL/PLATELET - Abnormal; Notable for the following components:      Result Value   RBC 3.82 (*)    MCV 101.6 (*)    All other components within normal limits  COMPREHENSIVE METABOLIC PANEL - Abnormal; Notable for the following components:   Sodium 133 (*)    Chloride 97 (*)    Calcium 8.5 (*)    Alkaline Phosphatase 131 (*)  All other components within normal limits  BRAIN NATRIURETIC PEPTIDE  TROPONIN I (HIGH SENSITIVITY)  TROPONIN I (HIGH SENSITIVITY)    EKG EKG Interpretation  Date/Time:  Sunday Nov 01 2022 18:48:05 EDT Ventricular Rate:  83 PR Interval:  168 QRS Duration: 87 QT Interval:  373 QTC Calculation: 439 R Axis:   -73 Text Interpretation: Sinus rhythm Probable left atrial enlargement Left anterior fascicular block Low voltage, precordial leads Consider anterior infarct No significant change since last tracing Confirmed by Gwyneth Sprout ([id number]) on 11/01/2022 7:15:25 PM  Radiology DG Chest Port 1 View  Result Date: 11/01/2022 CLINICAL DATA:  Chest pain EXAM: PORTABLE CHEST 1 VIEW COMPARISON:  08/28/2022 and prior studies FINDINGS: The cardiomediastinal silhouette is unchanged. There is no evidence of focal airspace disease, pulmonary edema, suspicious pulmonary nodule/mass, pleural effusion, or pneumothorax. No acute bony abnormalities are identified. IMPRESSION: No active disease. Electronically Signed   By: Harmon Pier M.D.   On: 11/01/2022 20:07    Procedures Procedures    Medications Ordered in ED Medications - No data to display  ED Course/ Medical Decision Making/ A&P                             Medical Decision Making Amount and/or Complexity of Data Reviewed External Data Reviewed: notes. Labs: ordered.  Decision-making details documented in ED Course. Radiology: ordered and independent interpretation performed. Decision-making details documented in ED Course. ECG/medicine tests: ordered and independent interpretation performed. Decision-making details documented in ED Course.   Pt with multiple medical problems and comorbidities and presenting today with a complaint that caries a high risk for morbidity and mortality.  Patient here today due to concern for high blood pressure and reports since Wednesday she has had intermittent headaches, chest pain, feeling lethargic and just not feeling herself.  She has been checking her blood pressure daily and has been gradually elevating.  Yesterday and today were the worst.  When she noticed the elevated blood pressure yesterday she took an additional half of her amlodipine and lisinopril which seemed to help with the blood pressure which she did today as well but felt like her blood pressure did not improve.  She currently feels normal she has no pain and blood pressure here is 110/81.  She denies any new salty foods, medication changes or noncompliance.  She is neurovascularly intact.  Concern for hypertensive urgency which patient has now corrected by taking her medications.  Lower suspicion for MI or acute brain pathology.  Patient does have a history of cerebral aneurysm that has been clipped but is not complaining of any specific symptoms that would suggest new issue.  Will ensure no evidence of renal changes or ACS.  I independently interpreted patient's EKG which showed no acute findings today.  I have independently visualized and interpreted pt's images today.  Chest x-ray within normal limits.  10:13 PM I have independently interpreted patient's labs and CBC, BNP, troponin, CMP without acute findings.  On repeat evaluation patient's blood pressure has remained normal.  She remains asymptomatic.  Do not feel that patient needs second troponin or other  further testing at this time.  Did encouraged her to not take additional lisinopril but she can take an additional amlodipine as needed for elevated blood pressure.  She has follow-up with her doctor on Tuesday.  Patient is comfortable with this plan has no social determinants affecting her discharge today.  Final Clinical Impression(s) / ED Diagnoses Final diagnoses:  Hypertension, unspecified type    Rx / DC Orders ED Discharge Orders     None         Gwyneth Sprout, MD 11/01/22 2214

## 2022-11-01 NOTE — ED Triage Notes (Addendum)
Pt BIBGEMS from home after having dizziness from HTN that has been present since Wednesday. Pt took 324 ASA and her home BP meds, her reading was 173/palp per her home machine.    Hx htn on amlodipine and lisinopril

## 2022-11-01 NOTE — Discharge Instructions (Addendum)
All your blood test look normal today.  Your blood pressure has looked great here.  In the future if your blood pressure is running high do not take additional lisinopril but you can take 1 full additional tablet of amlodipine.  Plan on following up with your doctor on Tuesday as planned.  Make sure you take your blood pressure cuff with you so you can make sure the reads you are getting at home are similar to the ones in the office.
